# Patient Record
Sex: Female | Born: 2005 | Race: White | Hispanic: No | Marital: Single | Smoking: Never smoker
Health system: Southern US, Community
[De-identification: ages and names within clinical notes are randomized; demographics above are authoritative.]

## PROBLEM LIST (undated history)

## (undated) DIAGNOSIS — R634 Abnormal weight loss: Secondary | ICD-10-CM

## (undated) DIAGNOSIS — T7840XA Allergy, unspecified, initial encounter: Secondary | ICD-10-CM

## (undated) DIAGNOSIS — F411 Generalized anxiety disorder: Secondary | ICD-10-CM

## (undated) HISTORY — DX: Generalized anxiety disorder: F41.1

## (undated) HISTORY — DX: Abnormal weight loss: R63.4

## (undated) HISTORY — DX: Allergy, unspecified, initial encounter: T78.40XA

---
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
## 2023-01-01 ENCOUNTER — Ambulatory Visit: Payer: 59 | Admitting: Adult Health

## 2023-01-01 DIAGNOSIS — G43719 Chronic migraine without aura, intractable, without status migrainosus: Secondary | ICD-10-CM | POA: Diagnosis not present

## 2023-01-01 MED ORDER — ONABOTULINUMTOXINA 200 UNITS IJ SOLR
155.0000 [IU] | Freq: Once | INTRAMUSCULAR | Status: AC
Start: 2023-01-01 — End: 2023-01-01
  Administered 2023-01-01: 155 [IU] via INTRAMUSCULAR

## 2023-01-01 NOTE — Patient Instructions (Addendum)
Your Plan:  Continue Bernita Raisin as needed  Will repeat botox in 3 months but call earlier if needed         Thank you for coming to see [place] at [facility]. I hope we have been able to provide you high quality care today.  You may receive a patient satisfaction survey over the next few weeks. We would appreciate your feedback and comments so that we may continue to improve ourselves and the health of our patients.

## 2023-01-01 NOTE — Progress Notes (Signed)
Patient is here for initial botox injection. Continues to have daily headaches, will use Ubrelvy for more severe migraines with some benefit (takes edge off but doesn't abort). Did do an Ajovy injection (sample) on 9/20, did help some in regards to severity. 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 Will return in 3 months for repeat injection.       Consent Form Botulism Toxin Injection For Chronic Migraine    Reviewed orally with patient, additionally signature is on file:  Botulism toxin has been approved by the Federal drug administration for treatment of chronic migraine. Botulism toxin does not cure chronic migraine and it may not be effective in some patients.  The administration of botulism toxin is accomplished by injecting a small amount of toxin into the muscles of the neck and head. Dosage must be titrated for each individual. Any benefits resulting from botulism toxin tend to wear off after 3 months with a repeat injection required if benefit is to be maintained. Injections are usually done every 3-4 months with maximum effect peak achieved by about 2 or 3 weeks. Botulism toxin is expensive and you should be sure of what costs you will incur resulting from the injection.  The side effects of botulism toxin use for chronic migraine may include:   -Transient, and usually mild, facial weakness with facial injections  -Transient, and usually mild, head or neck weakness with head/neck injections  -Reduction or loss of forehead facial animation due to forehead muscle weakness  -Eyelid drooping  -Dry eye  -Pain at the site of injection or bruising at the site of injection  -Double vision  -Potential unknown long term risks   Contraindications: You should not have Botox if you are pregnant, nursing, allergic to albumin, have an infection, skin  condition, or muscle weakness at the site of the injection, or have myasthenia gravis, Lambert-Eaton syndrome, or ALS.  It is also possible that as with any injection, there may be an allergic reaction or no effect from the medication. Reduced effectiveness after repeated injections is sometimes seen and rarely infection at the injection site may occur. All care will be taken to prevent these side effects. If therapy is given over a long time, atrophy and wasting in the muscle injected may occur. Occasionally the patient's become refractory to treatment because they develop antibodies to the toxin. In this event, therapy needs to be modified.  I have read the above information and consent to the administration of botulism toxin.    BOTOX PROCEDURE NOTE FOR MIGRAINE HEADACHE  Contraindications and precautions discussed with patient(above). Aseptic procedure was observed and patient tolerated procedure. Procedure performed by Ihor Austin, AGNP-BC.   The condition has existed for more than 6 months, and pt does not have a diagnosis of ALS, Myasthenia Gravis or Lambert-Eaton Syndrome.  Risks and benefits of injections discussed and pt agrees to proceed with the procedure.  Written consent obtained  These injections are medically necessary. Pt  receives good benefits from these injections. These injections do not cause sedations or hallucinations which the oral therapies may cause.   Description of procedure:  The patient was placed in a sitting position. The standard protocol was used for Botox as follows, with 5 units of Botox injected at each site:  -Procerus muscle, midline injection  -Corrugator muscle, bilateral injection  -Frontalis muscle, bilateral injection, with  2 sites each side, medial injection was performed in the upper one third of the frontalis muscle, in the region vertical from the medial inferior edge of the superior orbital rim. The lateral injection was again in the upper  one third of the forehead vertically above the lateral limbus of the cornea, 1.5 cm lateral to the medial injection site.  -Temporalis muscle injection, 4 sites, bilaterally. The first injection was 3 cm above the tragus of the ear, second injection site was 1.5 cm to 3 cm up from the first injection site in line with the tragus of the ear. The third injection site was 1.5-3 cm forward between the first 2 injection sites. The fourth injection site was 1.5 cm posterior to the second injection site. 5th site laterally in the temporalis  muscleat the level of the outer canthus.  -Occipitalis muscle injection, 3 sites, bilaterally. The first injection was done one half way between the occipital protuberance and the tip of the mastoid process behind the ear. The second injection site was done lateral and superior to the first, 1 fingerbreadth from the first injection. The third injection site was 1 fingerbreadth superiorly and medially from the first injection site.  -Cervical paraspinal muscle injection, 2 sites, bilaterally. The first injection site was 1 cm from the midline of the cervical spine, 3 cm inferior to the lower border of the occipital protuberance. The second injection site was 1.5 cm superiorly and laterally to the first injection site.  -Trapezius muscle injection was performed at 3 sites, bilaterally. The first injection site was in the upper trapezius muscle halfway between the inflection point of the neck, and the acromion. The second injection site was one half way between the acromion and the first injection site. The third injection was done between the first injection site and the inflection point of the neck.    A total of 200 units of Botox was prepared, 155 units of Botox was injected as documented above, any Botox not injected was wasted. The patient tolerated the procedure well, there were no complications of the above procedure.   Ihor Austin, AGNP-BC  [facility] [address] [place], [place] [address]  Phone [phone] Fax [phone] Note: This document was prepared with digital dictation and possible smart phrase technology. Any transcriptional errors that result from this process are unintentional.

## 2023-01-01 NOTE — Progress Notes (Signed)
Botox- 200 units x 1 vial Lot: [id number] Expiration: 04/2025 NDC: 9147-8295-62  Bacteriostatic 0.9% Sodium Chloride- * mL  Lot: [id number] Expiration: 06/25/2023 NDC: 7846-9629-52  Dx: 43.709 B/B Witnessed by Marlana Latus, RN

## 2023-01-18 ENCOUNTER — Encounter: Payer: Self-pay | Admitting: Adult Health

## 2023-01-22 ENCOUNTER — Other Ambulatory Visit: Payer: Self-pay | Admitting: Psychiatry

## 2023-01-22 MED ORDER — AJOVY 225 MG/1.5ML ~~LOC~~ SOAJ: 1.5000 mL | Freq: Once | SUBCUTANEOUS | Status: AC

## 2023-01-22 NOTE — Telephone Encounter (Signed)
Yes that is fine, hopefully this will help migraines while botox taking effect over the next couple of months. If she wishes to continue Ajovy and botox ongoing, will need to place order through insurance to see if they will approve both but okay to provide sample short term. Thank you.

## 2023-01-23 ENCOUNTER — Ambulatory Visit
Admission: RE | Admit: 2023-01-23 | Discharge: 2023-01-23 | Disposition: A | Discharge status: Home / Self Care | Payer: 59 | Source: Ambulatory Visit | Attending: Psychiatry

## 2023-01-23 DIAGNOSIS — R200 Anesthesia of skin: Secondary | ICD-10-CM

## 2023-01-23 MED ORDER — GADOPICLENOL 0.5 MMOL/ML IV SOLN
5.0000 mL | Freq: Once | INTRAVENOUS | Status: AC | PRN
  Administered 2023-01-23: 5 mL via INTRAVENOUS

## 2023-01-28 ENCOUNTER — Telehealth: Payer: Self-pay

## 2023-01-28 NOTE — Telephone Encounter (Signed)
LVM with pt's CT Venogram results per Dr. Marjory Lies

## 2023-01-28 NOTE — Progress Notes (Signed)
Please call and advise the patient that the recent MRI Cervical spine was within normal limites. No further action is required on this test at this time. Please remind patient to keep any upcoming appointments or tests and to call us with any interim questions, concerns, problems or updates. Thanks,   Windell Norfolk, MD

## 2023-02-08 ENCOUNTER — Ambulatory Visit (INDEPENDENT_AMBULATORY_CARE_PROVIDER_SITE_OTHER): Payer: 59 | Admitting: Psychiatry

## 2023-02-08 DIAGNOSIS — F410 Panic disorder [episodic paroxysmal anxiety] without agoraphobia: Secondary | ICD-10-CM

## 2023-02-08 DIAGNOSIS — F411 Generalized anxiety disorder: Secondary | ICD-10-CM | POA: Diagnosis not present

## 2023-02-08 DIAGNOSIS — F900 Attention-deficit hyperactivity disorder, predominantly inattentive type: Secondary | ICD-10-CM | POA: Diagnosis not present

## 2023-02-08 DIAGNOSIS — F840 Autistic disorder: Secondary | ICD-10-CM | POA: Diagnosis not present

## 2023-02-08 MED ORDER — ESCITALOPRAM OXALATE 20 MG PO TABS
20.0000 mg | ORAL_TABLET | Freq: Every day | ORAL | 1 refills | Status: DC
Start: 2023-02-08 — End: 2023-05-21

## 2023-02-08 MED ORDER — QUILLIVANT XR 25 MG/5ML PO SRER: ORAL | 0 refills | Status: DC

## 2023-02-08 MED ORDER — GUANFACINE HCL ER 2 MG PO TB24
ORAL_TABLET | ORAL | 1 refills | Status: DC
Start: 2023-02-08 — End: 2023-05-21

## 2023-02-11 ENCOUNTER — Encounter: Payer: Self-pay | Admitting: Psychiatry

## 2023-02-11 NOTE — Progress Notes (Signed)
[facility] [address], [place]   Follow-up visit  Date of Service: 02/08/2023  CC/Purpose: Routine medication management follow up.    Terri [name] is a 17 y.o. female with a past psychiatric history of ADHD, ASD, anxiety who presents today for a psychiatric follow up appointment. Patient is in the custody of parents.    The patient was last seen on 11/06/22, at which time the following plan was established: Medication management:             - Continue Lexapro 20mg  daily for anxiety             - Intuniv 2mg  nightly for ADHD             - Quillivant 2.58mL daily and 1mL around lunch time for ADHD _______________________________________________________________________________________ Acute events/encounters since last visit: denies    Terri [name] presents with her mother for her visit. Overall things have been going okay. She has been taking her medicine as prescribed. She feels that the medicines are beneficial and mostly doing okay. She has stopped her birth control and is noticing some emotional changes with this, but doesn't feel it's related to her medicine. She is okay with her current regimen. No SI/HI/AVH.    Sleep: stable Appetite: Stable Depression: denies Bipolar symptoms:  denies Current suicidal/homicidal ideations:  denied Current auditory/visual hallucinations:  denied   Non-Suicidal Self-Injury: denies Suicide Attempt History: denies  Psychotherapy: Fredia Sorrow   Previous psychiatric medication trials:  Field seismologist for school Living Situation: lives with mom, dad     Allergies  Allergen Reactions   Penicillins Hives   Doxycycline Rash      Labs:  reviewed  Medical diagnoses: Patient Active Problem List   Diagnosis Date Noted   Migraines 08/28/2022   Chronic sinusitis 02/10/2021   Seasonal allergies 11/08/2020   Preventative health care 08/04/2020   Ingrown toenail 06/22/2019   Paronychia of  toe 09/23/2018   Menorrhagia with regular cycle 08/13/2018   Autism disorder 05/22/2018   ADHD, 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   There were no vitals taken for this visit.There is no height or weight on file to calculate BMI.  General Appearance: Neat and Well Groomed  Eye Contact:  Fair  Speech:  Clear and Coherent and Normal Rate  Mood:  Euthymic  Affect:  Appropriate and Congruent, happy  Thought Process:  Goal Directed  Orientation:  Full (Time, Place, and Person)  Thought Content:  Logical  Suicidal Thoughts:  No  Homicidal Thoughts:  No  Memory:  Immediate;   Good  Judgement:  Good  Insight:  Good  Psychomotor Activity:  Normal  Concentration:  Concentration: Good  Recall:  Good  Fund of Knowledge:  Good  Language:  Good  Assets:  Communication Skills Desire for Improvement Financial Resources/Insurance Housing Leisure Time Physical Health Resilience Social Support Talents/Skills Transportation Vocational/Educational  Cognition:  WNL      Assessment   Psychiatric Diagnoses:   ICD-10-CM   1. Autism spectrum disorder with accompanying intellectual impairment, requiring support (level 1)  F84.0     2. Generalized anxiety disorder with panic attacks  F41.1 escitalopram (LEXAPRO) 20 MG tablet   F41.0     3. ADHD, predominantly inattentive type  F90.0 guanFACINE (INTUNIV) 2 MG TB24 ER tablet  Methylphenidate HCl ER (QUILLIVANT XR) 25 MG/5ML SRER      Patient complexity: Moderate   Patient Education and Counseling:  Supportive therapy provided for identified psychosocial stressors.  Medication education provided and decisions regarding medication regimen discussed with patient/guardian.   On assessment today, Terri [name] has remained stable from a mental health perspective. She has remained on  her medicine and is doing well with this. She has some emotional changes which appears related to stopping her birth control. We will not adjust her medicines at this time and will monitor how she is doing. No SI/HI/AVH.   Plan  Medication management:  - Continue Lexapro 20mg  daily for anxiety  - Intuniv 2mg  nightly for ADHD  - Quillivant 2.40mL daily and 1mL around lunch time for ADHD  Labs/Studies:  - none today  Additional recommendations:  - Crisis plan reviewed and patient verbally contracts for safety. Go to ED with emergent symptoms or safety concerns and Risks, benefits, side effects of medications, including any / all black box warnings, discussed with patient, who verbalizes their understanding   Follow Up: Return in 3 months - Call in the interim for any side-effects, decompensation, questions, or problems between now and the next visit.   I have spent 25 minutes reviewing the patients chart, meeting with the patient and family, and reviewing medicines and side effects.   Kendal Hymen, MD [facility]

## 2023-02-19 ENCOUNTER — Telehealth: Payer: Self-pay | Admitting: Psychiatry

## 2023-02-19 NOTE — Telephone Encounter (Signed)
PT lvm that she needs a PA on her quillivant xr 25 mg. She said that her pharmacy has sent it to [place] twice. Please call pt back and let her know the status. Her number is [phone]

## 2023-02-19 NOTE — Telephone Encounter (Signed)
Called pt back and let her know that per Dr. Marjory Lies that her CT Venogram was normal. Pt verbalized understanding.

## 2023-02-20 ENCOUNTER — Telehealth: Payer: Self-pay | Admitting: Psychiatry

## 2023-02-20 NOTE — Telephone Encounter (Signed)
No PA noted in CMM will have to review insurance to initiate one, see prior phone message.

## 2023-02-20 NOTE — Telephone Encounter (Signed)
Not sure what they PA is for but will check CMM

## 2023-02-20 NOTE — Telephone Encounter (Signed)
Pt is checking status of PA

## 2023-02-26 NOTE — Telephone Encounter (Signed)
PA initiated with [employer] for Quillivant XR 25MG /5ML suspension, approval is received effective through 02/25/2024.   Will let Mom know.

## 2023-02-26 NOTE — Telephone Encounter (Signed)
See other phone message  

## 2023-04-01 ENCOUNTER — Telehealth: Payer: Self-pay | Admitting: Psychiatry

## 2023-04-01 ENCOUNTER — Other Ambulatory Visit: Payer: Self-pay

## 2023-04-01 DIAGNOSIS — F900 Attention-deficit hyperactivity disorder, predominantly inattentive type: Secondary | ICD-10-CM

## 2023-04-01 NOTE — Telephone Encounter (Signed)
 Pended Qu[facility]ivant to [facility].

## 2023-04-01 NOTE — Telephone Encounter (Signed)
 Mm called to request refill of Marci's Quillivant.  Appt 05/10/23.  [id number] [place], [place] - [address]

## 2023-04-02 MED ORDER — QUILLIVANT XR 25 MG/5ML PO SRER: ORAL | 0 refills | Status: DC

## 2023-04-08 ENCOUNTER — Ambulatory Visit: Payer: 59 | Admitting: Adult Health

## 2023-04-08 DIAGNOSIS — G43719 Chronic migraine without aura, intractable, without status migrainosus: Secondary | ICD-10-CM | POA: Diagnosis not present

## 2023-04-08 MED ORDER — ONABOTULINUMTOXINA 200 UNITS IJ SOLR
155.0000 [IU] | Freq: Once | INTRAMUSCULAR | Status: AC
  Administered 2023-04-08: 155 [IU] via INTRAMUSCULAR

## 2023-04-08 NOTE — Progress Notes (Signed)
 Botox- 200 units x 1 vial Lot:[id number] Expiration: 05/2025 NDC: 9562-1308-65  Bacteriostatic 0.9% Sodium Chloride- * mL  Lot: [id number] Expiration: 01/25/2024 NDC: 2952-8413-24  Dx:G43.719 B/B Witnessed by: Marlana Latus, RN

## 2023-04-08 NOTE — Progress Notes (Signed)
 Patient is here for second botox  injection, prior initial injection on 01/01/2023.  Reports some improvement of migraines after initial Botox  injection, still occurring almost daily but much less severity and are not debilitating. Use of Ubrelvy  for more severe migraines usually with some benefit.  Tolerated procedure well today, does need to lay down for procedure (hx of syncopal episodes d/t needle phobia).  Return in 3 months for repeat injection.     Consent Form Botulism Toxin Injection For Chronic Migraine    Reviewed orally with patient, additionally signature is on file:  Botulism toxin has been approved by the Federal drug administration for treatment of chronic migraine. Botulism toxin does not cure chronic migraine and it may not be effective in some patients.  The administration of botulism toxin is accomplished by injecting a small amount of toxin into the muscles of the neck and head. Dosage must be titrated for each individual. Any benefits resulting from botulism toxin tend to wear off after 3 months with a repeat injection required if benefit is to be maintained. Injections are usually done every 3-4 months with maximum effect peak achieved by about 2 or 3 weeks. Botulism toxin is expensive and you should be sure of what costs you will incur resulting from the injection.  The side effects of botulism toxin use for chronic migraine may include:   -Transient, and usually mild, facial weakness with facial injections  -Transient, and usually mild, head or neck weakness with head/neck injections  -Reduction or loss of forehead facial animation due to forehead muscle weakness  -Eyelid drooping  -Dry eye  -Pain at the site of injection or bruising at the site of injection  -Double vision  -Potential unknown long term risks   Contraindications: You should not have Botox  if you are pregnant, nursing, allergic to albumin, have an infection, skin condition, or muscle weakness  at the site of the injection, or have myasthenia gravis, Lambert-Eaton syndrome, or ALS.  It is also possible that as with any injection, there may be an allergic reaction or no effect from the medication. Reduced effectiveness after repeated injections is sometimes seen and rarely infection at the injection site may occur. All care will be taken to prevent these side effects. If therapy is given over a long time, atrophy and wasting in the muscle injected may occur. Occasionally the patient's become refractory to treatment because they develop antibodies to the toxin. In this event, therapy needs to be modified.  I have read the above information and consent to the administration of botulism toxin.    BOTOX  PROCEDURE NOTE FOR MIGRAINE HEADACHE  Contraindications and precautions discussed with patient(above). Aseptic procedure was observed and patient tolerated procedure. Procedure performed by Harlene Bogaert, AGNP-BC.   The condition has existed for more than 6 months, and pt does not have a diagnosis of ALS, Myasthenia Gravis or Lambert-Eaton Syndrome.  Risks and benefits of injections discussed and pt agrees to proceed with the procedure.  Written consent obtained  These injections are medically necessary. Pt  receives good benefits from these injections. These injections do not cause sedations or hallucinations which the oral therapies may cause.   Description of procedure:  The patient was placed in a sitting position. The standard protocol was used for Botox  as follows, with 5 units of Botox  injected at each site:  -Procerus muscle, midline injection  -Corrugator muscle, bilateral injection  -Frontalis muscle, bilateral injection, with 2 sites each side, medial injection was performed  in the upper one third of the frontalis muscle, in the region vertical from the medial inferior edge of the superior orbital rim. The lateral injection was again in the upper one third of the forehead  vertically above the lateral limbus of the cornea, 1.5 cm lateral to the medial injection site.  -Temporalis muscle injection, 4 sites, bilaterally. The first injection was 3 cm above the tragus of the ear, second injection site was 1.5 cm to 3 cm up from the first injection site in line with the tragus of the ear. The third injection site was 1.5-3 cm forward between the first 2 injection sites. The fourth injection site was 1.5 cm posterior to the second injection site. 5th site laterally in the temporalis  muscleat the level of the outer canthus.  -Occipitalis muscle injection, 3 sites, bilaterally. The first injection was done one half way between the occipital protuberance and the tip of the mastoid process behind the ear. The second injection site was done lateral and superior to the first, 1 fingerbreadth from the first injection. The third injection site was 1 fingerbreadth superiorly and medially from the first injection site.  -Cervical paraspinal muscle injection, 2 sites, bilaterally. The first injection site was 1 cm from the midline of the cervical spine, 3 cm inferior to the lower border of the occipital protuberance. The second injection site was 1.5 cm superiorly and laterally to the first injection site.  -Trapezius muscle injection was performed at 3 sites, bilaterally. The first injection site was in the upper trapezius muscle halfway between the inflection point of the neck, and the acromion. The second injection site was one half way between the acromion and the first injection site. The third injection was done between the first injection site and the inflection point of the neck.    A total of 200 units of Botox  was prepared, 155 units of Botox  was injected as documented above, any Botox  not injected was wasted. The patient tolerated the procedure well, there were no complications of the above procedure.   Harlene Bogaert, AGNP-BC  [facility] [address] [place], [place] [address]  Phone [phone] Fax [phone] Note: This document was prepared with digital dictation and possible smart phrase technology. Any transcriptional errors that result from this process are unintentional.

## 2023-05-10 ENCOUNTER — Ambulatory Visit: Payer: 59 | Admitting: Psychiatry

## 2023-05-16 DIAGNOSIS — D2272 Melanocytic nevi of left lower limb, including hip: Secondary | ICD-10-CM | POA: Diagnosis not present

## 2023-05-16 DIAGNOSIS — D225 Melanocytic nevi of trunk: Secondary | ICD-10-CM | POA: Diagnosis not present

## 2023-05-21 ENCOUNTER — Encounter: Payer: Self-pay | Admitting: Psychiatry

## 2023-05-21 ENCOUNTER — Ambulatory Visit (INDEPENDENT_AMBULATORY_CARE_PROVIDER_SITE_OTHER): Payer: BC Managed Care – PPO | Admitting: Psychiatry

## 2023-05-21 DIAGNOSIS — F411 Generalized anxiety disorder: Secondary | ICD-10-CM | POA: Diagnosis not present

## 2023-05-21 DIAGNOSIS — F840 Autistic disorder: Secondary | ICD-10-CM | POA: Diagnosis not present

## 2023-05-21 DIAGNOSIS — F900 Attention-deficit hyperactivity disorder, predominantly inattentive type: Secondary | ICD-10-CM

## 2023-05-21 DIAGNOSIS — F410 Panic disorder [episodic paroxysmal anxiety] without agoraphobia: Secondary | ICD-10-CM

## 2023-05-21 MED ORDER — ESCITALOPRAM OXALATE 20 MG PO TABS: 20.0000 mg | ORAL_TABLET | Freq: Every day | ORAL | 1 refills | Status: DC

## 2023-05-21 MED ORDER — GUANFACINE HCL ER 2 MG PO TB24: ORAL_TABLET | ORAL | 1 refills | Status: DC

## 2023-05-21 MED ORDER — QUILLIVANT XR 25 MG/5ML PO SRER: ORAL | 0 refills | Status: DC

## 2023-05-21 NOTE — Progress Notes (Signed)
 [facility] [address], [place]   Follow-up visit  Date of Service: 05/21/2023  CC/Purpose: Routine medication management follow up.    Terri [name] is a 18 y.o. female with a past psychiatric history of ADHD, ASD, anxiety who presents today for a psychiatric follow up appointment. Patient is in the custody of parents.    The patient was last seen on 02/08/23, at which time the following plan was established: Medication management:             - Continue Lexapro 20mg  daily for anxiety             - Intuniv 2mg  nightly for ADHD             - Quillivant 2.57mL daily and 1mL around lunch time for ADHD _______________________________________________________________________________________ Acute events/encounters since last visit: denies    Terri [name] presents with her mother for her visit. Overall they report that Terri [name] has been doing pretty well. She has been taking her medicines as prescribed and finds them helpful. She has been dealing with some social stressors but overall has no major issues. She was able to get some accommodations with her school, which has been helpful. No concerns about her medicines today. No SI/HI/AVH.    Sleep: stable Appetite: Stable Depression: denies Bipolar symptoms:  denies Current suicidal/homicidal ideations:  denied Current auditory/visual hallucinations:  denied   Non-Suicidal Self-Injury: denies Suicide Attempt History: denies  Psychotherapy: Fredia Sorrow   Previous psychiatric medication trials:  Field seismologist for school Living Situation: lives with mom, dad     Allergies  Allergen Reactions   Penicillins Hives   Doxycycline Rash      Labs:  reviewed  Medical diagnoses: Patient Active Problem List   Diagnosis Date Noted   Migraines 08/28/2022   Chronic sinusitis 02/10/2021   Seasonal allergies 11/08/2020   Preventative health care 08/04/2020   Ingrown toenail 06/22/2019    Paronychia of toe 09/23/2018   Menorrhagia with regular cycle 08/13/2018   Autism disorder 05/22/2018   ADHD, 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   There were no vitals taken for this visit.There is no height or weight on file to calculate BMI.  General Appearance: Neat and Well Groomed  Eye Contact:  Fair  Speech:  Clear and Coherent and Normal Rate  Mood:  Euthymic  Affect:  Appropriate and Congruent, happy  Thought Process:  Goal Directed  Orientation:  Full (Time, Place, and Person)  Thought Content:  Logical  Suicidal Thoughts:  No  Homicidal Thoughts:  No  Memory:  Immediate;   Good  Judgement:  Good  Insight:  Good  Psychomotor Activity:  Normal  Concentration:  Concentration: Good  Recall:  Good  Fund of Knowledge:  Good  Language:  Good  Assets:  Communication Skills Desire for Improvement Financial Resources/Insurance Housing Leisure Time Physical Health Resilience Social Support Talents/Skills Transportation Vocational/Educational  Cognition:  WNL      Assessment   Psychiatric Diagnoses:   ICD-10-CM   1. Autism spectrum disorder with accompanying intellectual impairment, requiring support (level 1)  F84.0     2. ADHD, predominantly inattentive type  F90.0 Methylphenidate HCl ER (QUILLIVANT XR) 25 MG/5ML SRER    guanFACINE (INTUNIV) 2 MG TB24 ER tablet    3. Generalized anxiety disorder with panic  attacks  F41.1 escitalopram (LEXAPRO) 20 MG tablet   F41.0       Patient complexity: Moderate   Patient Education and Counseling:  Supportive therapy provided for identified psychosocial stressors.  Medication education provided and decisions regarding medication regimen discussed with patient/guardian.   On assessment today, Terri [name] has remained stable from a mental health perspective.We will  not make any adjustments today. She is looking for a new therapist. We will also plan on doing a new prior auth as she has switched insurance. No SI/HI/AVH.   Plan  Medication management:  - Continue Lexapro 20mg  daily for anxiety  - Intuniv 2mg  nightly for ADHD  - Quillivant 2.79mL daily and 1mL around lunch time for ADHD  Labs/Studies:  - none today  Additional recommendations:  - Crisis plan reviewed and patient verbally contracts for safety. Go to ED with emergent symptoms or safety concerns and Risks, benefits, side effects of medications, including any / all black box warnings, discussed with patient, who verbalizes their understanding   Follow Up: Return in 3 months - Call in the interim for any side-effects, decompensation, questions, or problems between now and the next visit.   I have spent 25 minutes reviewing the patients chart, meeting with the patient and family, and reviewing medicines and side effects.   Kendal Hymen, MD [facility]

## 2023-05-22 ENCOUNTER — Telehealth: Payer: Self-pay

## 2023-05-22 NOTE — Telephone Encounter (Signed)
 Prior Authorization Qullivant XR 25 mg/27mL  [employer] [place]

## 2023-05-25 IMAGING — MR MR HEAD W/O CM
6 of 11 series · 26 of 48 positions shown · non-contrast
Comparison: None.

CLINICAL DATA: Headache, chronic, new features or increased
frequency

EXAM:
MRI HEAD WITHOUT CONTRAST
TECHNIQUE: Multiplanar, multiecho pulse sequences of the brain and surrounding
structures were obtained without intravenous contrast.

[Series 2: DWI · axial · 3.0mm · 0.94mm/px · z∈[-139,-3]mm · 8 of 100 slices shown (1 of 2)]
[im 1/100]
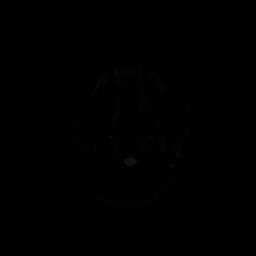
[im 12/100]
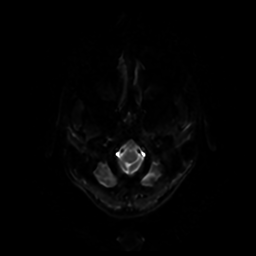
[im 34/100]
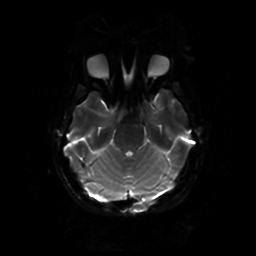
[im 45/100]
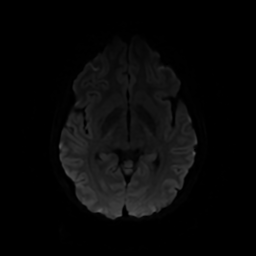
[im 56/100]
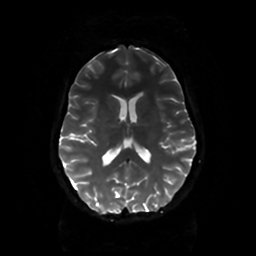
[im 67/100]
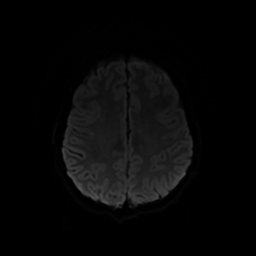
[im 89/100]
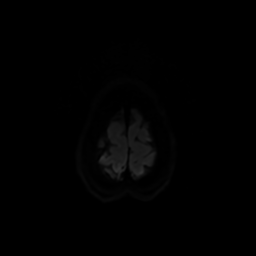
[im 100/100]
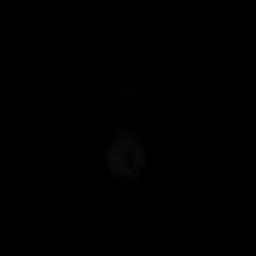

[Series 3: DWI · coronal · 4.0mm · 0.94mm/px · 6 of 72 slices shown (2 of 2)]
[im 1/72]
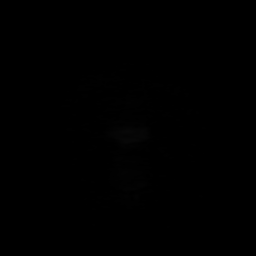
[im 15/72]
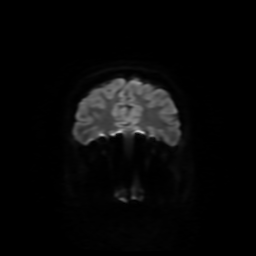
[im 29/72]
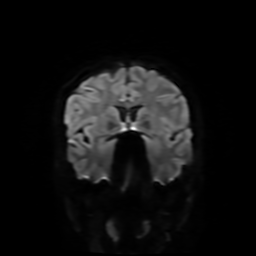
[im 43/72]
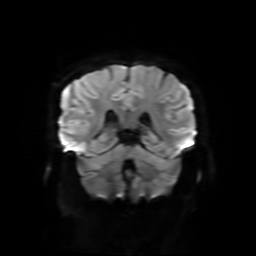
[im 57/72]
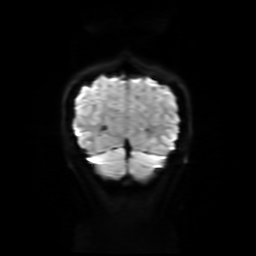
[im 72/72]
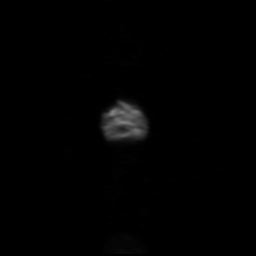

[Series 4: FLAIR · sagittal · 5.0mm · 0.23mm/px · 2 of 25 slices shown (1 of 2)]
[im 1/25]
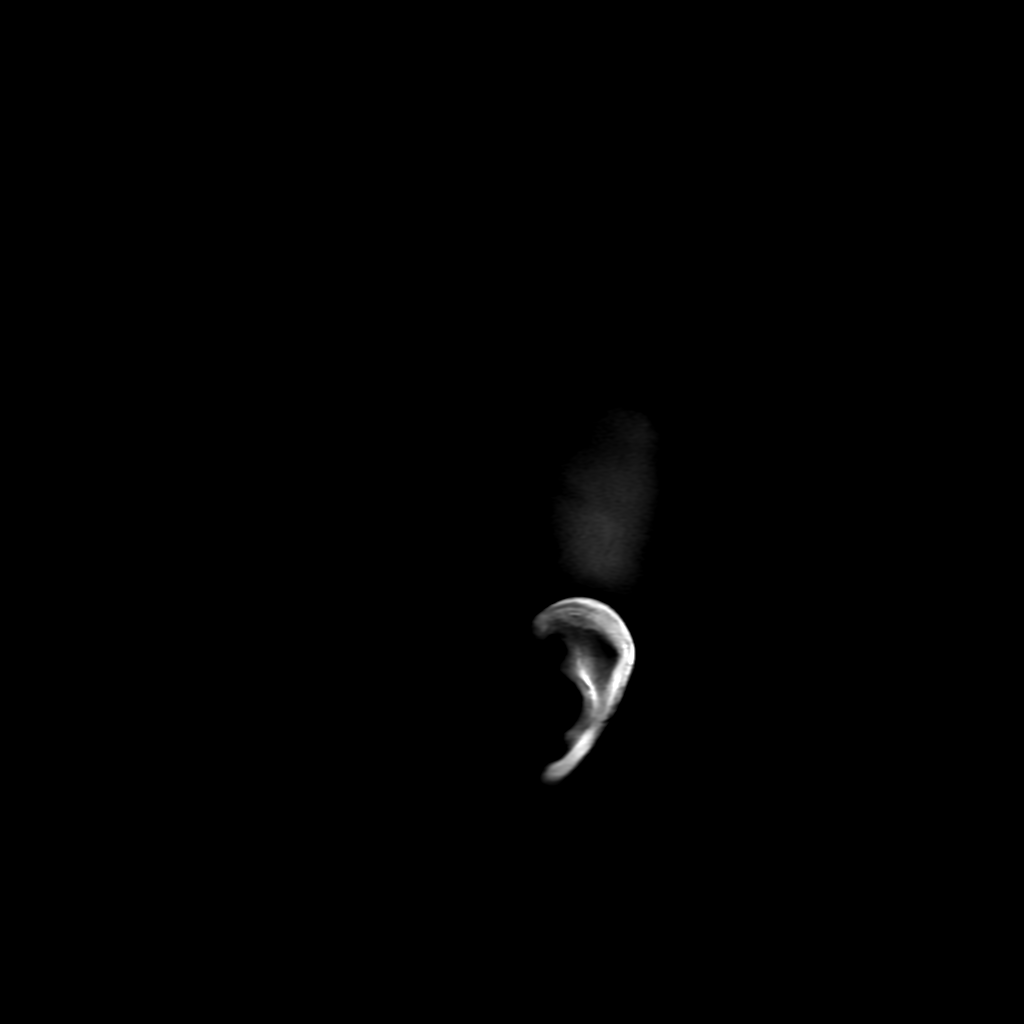
[im 25/25]
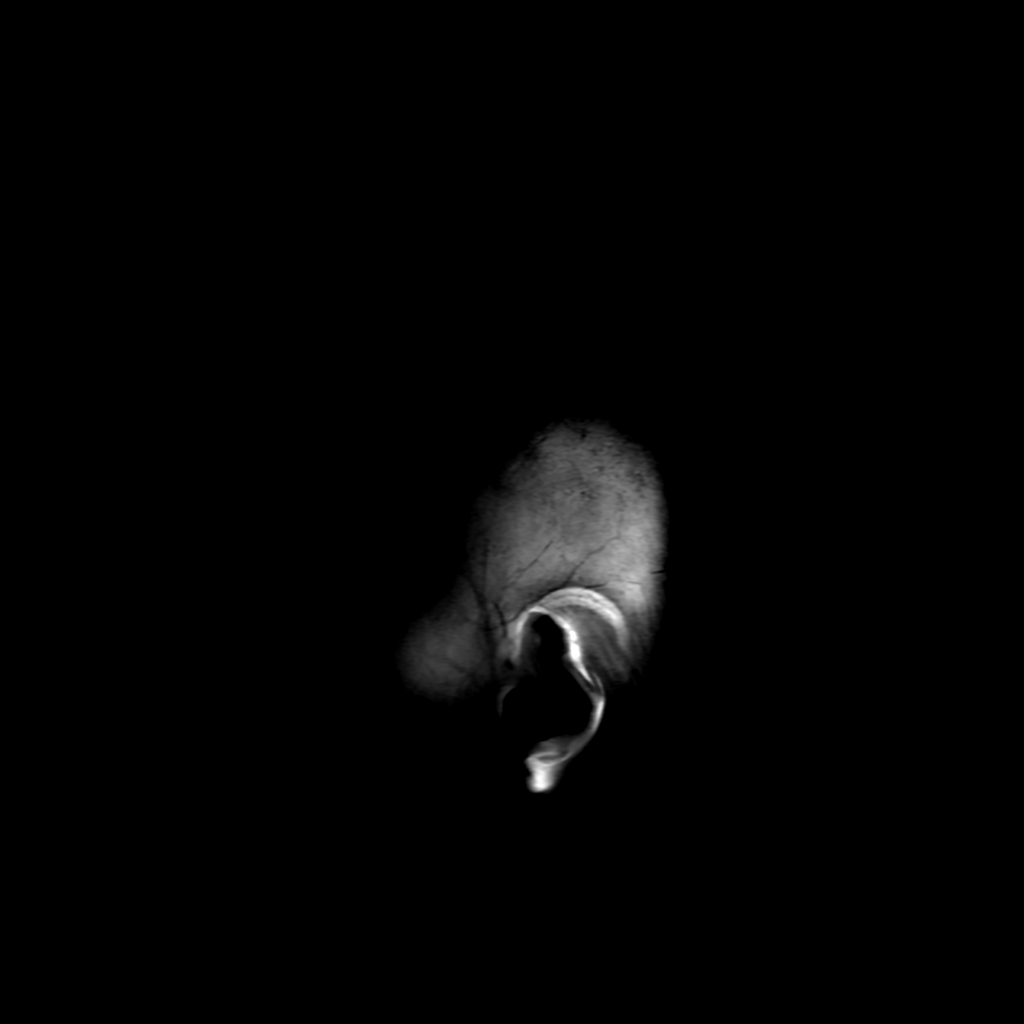

[Series 7: FLAIR · axial · 4.0mm · 0.45mm/px · z∈[-136,-1]mm · 3 of 34 slices shown (2 of 2)]
[im 1/34]
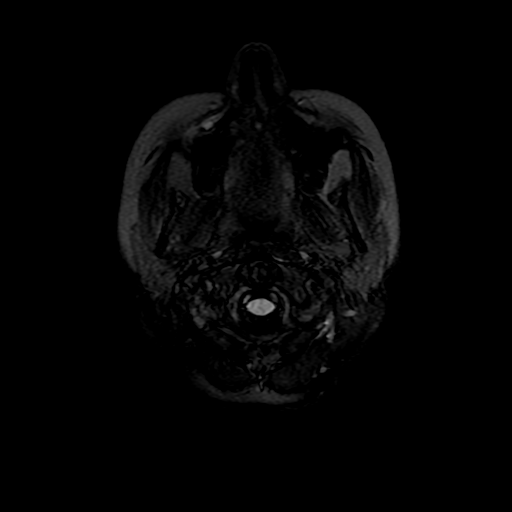
[im 17/34]
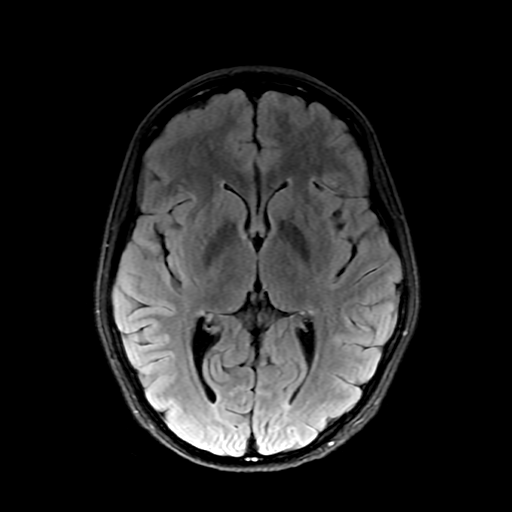
[im 34/34]
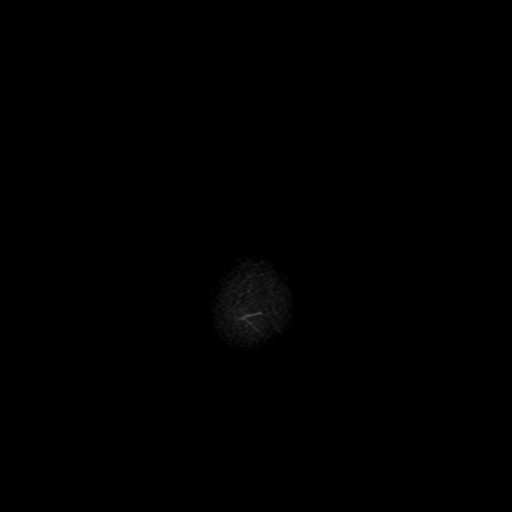

[Series 250: ADC · axial · 3.0mm · 0.94mm/px · z∈[-139,-3]mm · 4 of 49 slices shown (1 of 2)]
[im 1/49]
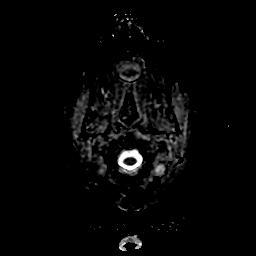
[im 17/49]
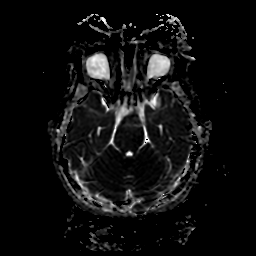
[im 33/49]
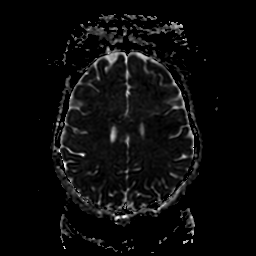
[im 49/49]
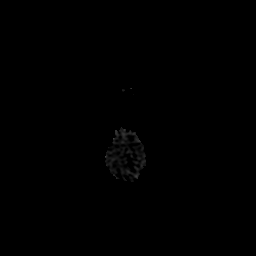

[Series 350: ADC · coronal · 4.0mm · 0.94mm/px · 3 of 36 slices shown (2 of 2)]
[im 1/36]
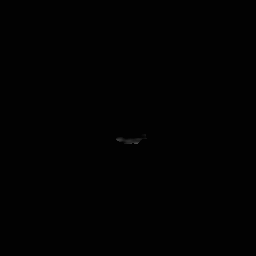
[im 18/36]
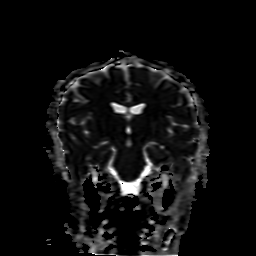
[im 36/36]
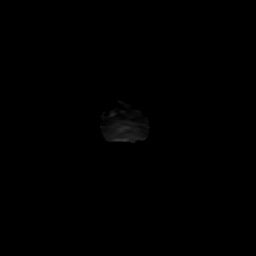

[26 of 48 positions shown; findings below may reference images not displayed]

FINDINGS: Brain: No acute infarction, hemorrhage, hydrocephalus, extra-axial
collection or mass lesion.

Vascular: Major arterial flow voids are maintained at the skull
base.

Skull and upper cervical spine: Normal marrow signal.

Sinuses/Orbits: Clear sinuses.  No acute orbital findings.

Other: No mastoid effusions.
IMPRESSION: Normal brain MRI.  No acute abnormality.

## 2023-06-04 ENCOUNTER — Telehealth: Payer: Self-pay | Admitting: Adult Health

## 2023-06-04 NOTE — Telephone Encounter (Signed)
 Pt has new [employer] [place] insurance. Completed PA form and placed in nurse pod for NP signature.

## 2023-06-06 ENCOUNTER — Encounter: Payer: Self-pay | Admitting: Adult Health

## 2023-06-06 ENCOUNTER — Telehealth: Payer: Self-pay

## 2023-06-06 ENCOUNTER — Other Ambulatory Visit (HOSPITAL_COMMUNITY): Payer: Self-pay

## 2023-06-06 ENCOUNTER — Telehealth: Payer: Self-pay | Admitting: *Deleted

## 2023-06-06 NOTE — Telephone Encounter (Signed)
 Pharmacy Patient Advocate Encounter  Received notification from [employer] [place]  that Prior Authorization for Terri [name] 100MG  tablets has been APPROVED from 06-06-2023 to 03-25-2038   PA #/Case ID/Reference #: [id number]

## 2023-06-06 NOTE — Telephone Encounter (Signed)
 Patient said PA is needed for Ubrelvy.

## 2023-06-06 NOTE — Telephone Encounter (Signed)
 Pharmacy Patient Advocate Encounter   Received notification from Pt Calls Messages that prior authorization for Ubrelvy 100MG  tablets is required/requested.   Insurance verification completed.   The patient is insured through [employer] [place]  .   Per test claim: PA required; PA submitted to above mentioned insurance via CoverMyMeds Key/confirmation #/EOC [id number] Status is pending

## 2023-06-06 NOTE — Telephone Encounter (Signed)
 PA request has been Submitted. New Encounter has been or will be created for follow up. For additional info see Pharmacy Prior Auth telephone encounter from 06-06-2023.

## 2023-06-10 NOTE — Telephone Encounter (Signed)
 Patient received repeat Botox in 03/2023 which was only her second injection. Botox can take up to 3 injections prior to seeing full benefit. She noted improvement of severity and duration after initial injection in 12/2022. Please call patient to see if she is having less migraine days since starting botox in order for insurance to continue to cover.  Thank you.

## 2023-06-10 NOTE — Telephone Encounter (Signed)
 Received fax of denial from [employer] [place]. They state they need to know pt is having less headache days after Botox. Previous note states pt had some improvement, but headaches were still debilitating. Attached denial letter below.

## 2023-06-11 NOTE — Telephone Encounter (Signed)
 Christa from [employer] [place] called back. She requested I send her this documentation reflecting the improvement with botox. Fax # [phone] and attn to her. Fax sent.

## 2023-06-11 NOTE — Telephone Encounter (Signed)
 I called the number on the denial and left a detailed message asking for them to reconsider approving the botox for the patient. Left message including the pt name, member ID, refer #, and provider requesting. I did include what Shanda Bumps stated in her message about having some improvement. I have requested a call back and reached out to the patient as well in the meantime.

## 2023-06-24 ENCOUNTER — Encounter: Payer: Self-pay | Admitting: Adult Health

## 2023-06-24 NOTE — Telephone Encounter (Signed)
 Noted! Thank you

## 2023-06-24 NOTE — Telephone Encounter (Signed)
 FYI- Botox appeal was denied. Sent MyChart msg to pt to see if she'd like to move forward with complaint form.

## 2023-06-26 ENCOUNTER — Telehealth: Payer: Self-pay | Admitting: Adult Health

## 2023-06-26 NOTE — Telephone Encounter (Signed)
 Pt is asking for a call back at [phone]

## 2023-06-26 NOTE — Telephone Encounter (Signed)
 Pt's mother reports that due to cost she is being told they would have to pay for Botox , she would like a call to discuss other options for pt.

## 2023-06-26 NOTE — Telephone Encounter (Signed)
 Spoke w/Pt regarding concerns for cost of Botox. Pt is asking if there is another option since her insurance will not cover the cost. She did inquire on the Botox savings program link she was sent but was told they would not cover any portion of cost if her insurance had not approved the medication. Advised we will make the provider aware. Pt voiced thanks for call back and that she will let her mother know she spoke with [place] since her mother had called earlier.

## 2023-06-26 NOTE — Telephone Encounter (Signed)
 Attempted to call Pt mother to discuss options for Botox cost concerns. No answer, LVM to call back.

## 2023-06-26 NOTE — Telephone Encounter (Signed)
 I have only previously seen her for Botox injections.  If she would like to discuss alternative options, please schedule follow-up visit. Can be either in office or mychart video visit but will need to be for 30 minute length. Thank you.

## 2023-06-27 NOTE — Telephone Encounter (Signed)
 LVM and sent MyChart msg informing pt that Botox appt will be changed to OV to discuss other treatment options.

## 2023-07-02 ENCOUNTER — Encounter: Payer: Self-pay | Admitting: Adult Health

## 2023-07-02 ENCOUNTER — Telehealth (INDEPENDENT_AMBULATORY_CARE_PROVIDER_SITE_OTHER): Payer: 59 | Admitting: Adult Health

## 2023-07-02 DIAGNOSIS — G43109 Migraine with aura, not intractable, without status migrainosus: Secondary | ICD-10-CM | POA: Diagnosis not present

## 2023-07-02 MED ORDER — AJOVY 225 MG/1.5ML ~~LOC~~ SOAJ: 225.0000 mg | SUBCUTANEOUS | 11 refills | Status: AC

## 2023-07-02 MED ORDER — UBRELVY 100 MG PO TABS: 100.0000 mg | ORAL_TABLET | ORAL | 6 refills | Status: DC | PRN

## 2023-07-02 MED ORDER — DICLOFENAC POTASSIUM 50 MG PO TABS: ORAL_TABLET | ORAL | 6 refills | Status: AC

## 2023-07-02 NOTE — Progress Notes (Signed)
 CC:  headaches  Virtual Visit via Video Note  I connected with Terri [name] on 07/02/23 at  9:45 AM EDT by a video enabled telemedicine application and verified that I am speaking with the correct person using two identifiers.  Location: Patient: at home Provider: in office, [facility]   I discussed the limitations of evaluation and management by telemedicine and the availability of in person appointments. The patient expressed understanding and agreed to proceed.    Follow-up Visit   Brief HPI: 18 year old female with a history of autism, GAD who follows in clinic for migraines. MRI brain 06/10/21 was unremarkable.  CTV and MRI C-spine benign.     Interval History:  Returns today to discuss other treatment options for migraine headaches.  Received 2 rounds of Botox with improvement of migraine headaches from daily down to 1 to 2/week but insurance declined to continue coverage. She has filed an appeal about 2 weeks ago. Last botox 1/13.  Currently experiencing about 5 migraines per month, only 1 of those migraines were severe and debilitating.  Use of Ubrelvy and/or diclofenac with benefit.  She can have occasional left eye blurred vision prior to migraine.  She is understandably frustrated that insurance will not continue to cover Botox as she was starting to feel like she was getting her life back and able to function normally again.    Headache days per month: 5 (prior to botox 30) Headache free days per month: 25  Current Headache Regimen: Preventative: none Abortive: Ubrelvy 100 mg PRN, diclofenac    Prior Therapies                                  Rescue: Tylenol ibuprofen Maxalt 10 mg PRN Ubrelvy 100 mg PRN Nurtec 75 mg PRN - lack of efficacy  Prevention: Lexapro 20 mg daily Topamax 100 mg at bedtime - weight loss, lack of efficacy Emgality - helped for 2 weeks, then stopped working Qulipta 30 mg daily - constipation, hair loss MigRelief Botox - helped,  insurance denied Aimovig d/t issues with constipation       ROS:   14 system review of systems performed and negative with exception of those listed in HPI    Outpatient Encounter Medications as of 07/02/2023  Medication Sig Note   acetaminophen (TYLENOL) 500 MG tablet Take 1,000 mg by mouth every 8 (eight) hours as needed.    Aspirin-Salicylamide-Caffeine (BC HEADACHE POWDER PO) Take by mouth. 12/12/2022: Takes prn   diclofenac (CATAFLAM) 50 MG tablet Take 50-100 mg as needed for migraine    drospirenone-ethinyl estradiol (NIKKI) 3-0.02 MG tablet Take 1 tablet by mouth daily. (Patient not taking: Reported on 04/08/2023)    escitalopram (LEXAPRO) 20 MG tablet Take 1 tablet (20 mg total) by mouth daily with breakfast.    guanFACINE (INTUNIV) 2 MG TB24 ER tablet TAKE 1 TABLET BY MOUTH EVERY DAY WITH DINNER    ibuprofen (ADVIL) 200 MG tablet Take 400-600 mg by mouth every 8 (eight) hours as needed.    Melatonin-Pyridoxine (MELATIN PO) Take 5 mg by mouth at bedtime.    Methylphenidate HCl ER (QUILLIVANT XR) 25 MG/5ML SRER Give 2-4 mL after breakfast and 1-2 mL after school for homework and driving    mometasone (NASONEX) 50 MCG/ACT nasal spray Place 2 sprays into the nose daily.    Multiple Vitamin (MULTIVITAMIN) tablet Take 1 tablet by mouth daily.  Omega-3 Fatty Acids (OMEGA 3 500 PO) Take 1 tablet by mouth daily as needed (Take 2 times per day).    Ubrogepant (UBRELVY) 100 MG TABS Take 1 tablet (100 mg total) by mouth as needed (for migraine). May repeat a dose in 2 hours if headache persists. Max dose 2 pills in 24 hours    No facility-administered encounter medications on file as of 07/02/2023.   Past Medical History:  Diagnosis Date   Allergy    GAD (generalized anxiety disorder)    Near syncope 08/13/2018   Weight loss    No past surgical history on file.       Physical Exam:   GENERAL:  well appearing, in no acute distress, alert   NEUROLOGICAL: Mental Status: Alert,  oriented to person, place and time, Follows commands, and Speech fluent and appropriate.    IMPRESSION: 18 year old female with a history of autism, GAD who presents for follow up of migraines.  CTV 12/2022 benign.  MRI cervical spine 12/2022 benign.  Received 2 rounds of Botox in 12/2022 and 03/2023 with decrease in migraine headaches to 1-2/week down from daily but insurance declined continue coverage.  Patient currently appealing insurance decision.    PLAN: -Preventative: While waiting on decision from insurance regarding Botox, recommend initiating Ajovy monthly injection for prevention.  Advised to call after 3 months if no benefit. -Rescue: Continue Ubrelvy and/or diclofenac as needed -Advised to avoid estrogen containing contraceptive measures due to aura migraine    Follow-up in 6 months or call earlier if needed     I spent 25 minutes of face-to-face and non-face-to-face time with patient via MyChart video visit.  This included previsit chart review, lab review, study review, order entry, electronic health record documentation, patient education and discussion regarding above diagnoses and treatment plan and answered all other questions to patient's satisfaction  Ihor Austin, [facility]  [facility] [address] [place], [place] [address]  Phone [phone] Fax [phone] Note: This document was prepared with digital dictation and possible smart phrase technology. Any transcriptional errors that result from this process are unintentional.

## 2023-07-02 NOTE — Patient Instructions (Addendum)
 Your Plan:  Start Ajovy monthly injection  Let me know after 3rd injection if no benefit or sooner if any difficulty tolerating   Continue Ubrelvy and diclofenac as needed for rescue      Follow up in 6 months or call earlier if needed      Thank you for coming to see [place] at [facility]. I hope we have been able to provide you high quality care today.  You may receive a patient satisfaction survey over the next few weeks. We would appreciate your feedback and comments so that we may continue to improve ourselves and the health of our patients.

## 2023-07-04 ENCOUNTER — Telehealth: Payer: Self-pay

## 2023-07-04 ENCOUNTER — Other Ambulatory Visit (HOSPITAL_COMMUNITY): Payer: Self-pay

## 2023-07-04 NOTE — Telephone Encounter (Signed)
 Pharmacy Patient Advocate Encounter   Received notification from Patient Advice Request messages that prior authorization for AJOVY (fremanezumab-vfrm) injection 225MG /1.5ML auto-injectors is required/requested.   Insurance verification completed.   The patient is insured through [facility]  .   Per test claim: PA required; PA submitted to above mentioned insurance via CoverMyMeds Key/confirmation #/EOC [id number] Status is pending

## 2023-07-05 ENCOUNTER — Encounter: Payer: Self-pay | Admitting: Adult Health

## 2023-07-08 ENCOUNTER — Other Ambulatory Visit (HOSPITAL_COMMUNITY): Payer: Self-pay

## 2023-07-08 NOTE — Telephone Encounter (Signed)
 Pharmacy Patient Advocate Encounter  Received notification from [employer] [place]   that Prior Authorization for AJOVY (fremanezumab-vfrm) injection 225MG /1.5ML auto-injectors has been APPROVED from 07/05/2023 to 03/25/2098. Ran test claim, Copay is $24.98. This test claim was processed through [facility]- copay amounts may vary at other pharmacies due to pharmacy/plan contracts, or as the patient moves through the different stages of their insurance plan.   PA #/Case ID/Reference #: PA Case ID #: [id number]

## 2023-07-08 NOTE — Telephone Encounter (Signed)
 Note:

## 2023-07-09 ENCOUNTER — Encounter: Payer: Self-pay | Admitting: Primary Care

## 2023-07-09 ENCOUNTER — Ambulatory Visit: Attending: Primary Care

## 2023-07-09 ENCOUNTER — Ambulatory Visit: Payer: Self-pay | Admitting: Primary Care

## 2023-07-09 VITALS — BP 94/64 | HR 88 | Temp 97.8°F | Ht 60.27 in | Wt 104.0 lb

## 2023-07-09 DIAGNOSIS — R002 Palpitations: Secondary | ICD-10-CM | POA: Insufficient documentation

## 2023-07-09 DIAGNOSIS — R229 Localized swelling, mass and lump, unspecified: Secondary | ICD-10-CM

## 2023-07-09 DIAGNOSIS — R42 Dizziness and giddiness: Secondary | ICD-10-CM

## 2023-07-09 LAB — CBC
HCT: 41.8 % (ref 36.0–49.0)
Hemoglobin: 13.5 g/dL (ref 12.0–16.0)
MCHC: 32.4 g/dL (ref 31.0–37.0)
MCV: 84 fl (ref 78.0–98.0)
Platelets: 262 10*3/uL (ref 150.0–575.0)
RBC: 4.98 Mil/uL (ref 3.80–5.70)
RDW: 14 % (ref 11.4–15.5)
WBC: 8.2 10*3/uL (ref 4.5–13.5)

## 2023-07-09 LAB — IBC + FERRITIN
Ferritin: 11.2 ng/mL (ref 10.0–291.0)
Iron: 72 ug/dL (ref 42–145)
Saturation Ratios: 14.2 % — ABNORMAL LOW (ref 20.0–50.0)
TIBC: 506.8 ug/dL — ABNORMAL HIGH (ref 250.0–450.0)
Transferrin: 362 mg/dL — ABNORMAL HIGH (ref 212.0–360.0)

## 2023-07-09 LAB — BASIC METABOLIC PANEL WITH GFR
BUN: 17 mg/dL (ref 6–23)
CO2: 24 meq/L (ref 19–32)
Calcium: 9.9 mg/dL (ref 8.4–10.5)
Chloride: 103 meq/L (ref 96–112)
Creatinine, Ser: 0.61 mg/dL (ref 0.40–1.20)
GFR: 129.57 mL/min (ref >60.00)
Glucose, Bld: 93 mg/dL (ref 70–99)
Potassium: 4.4 meq/L (ref 3.5–5.1)
Sodium: 135 meq/L (ref 135–145)

## 2023-07-09 LAB — TSH: TSH: 1.31 u[IU]/mL (ref 0.40–5.00)

## 2023-07-09 NOTE — Assessment & Plan Note (Signed)
 Exam today represent above small sebaceous cyst. No suspicious lesions.  Recommended watchful waiting.  She will update if the areas come increase in size and become more painful.

## 2023-07-09 NOTE — Progress Notes (Signed)
 Subjective:    Patient ID: Terri [name], female    DOB: 01-08-2006, 18 y.o.   MRN: [id number]  HPI  Terri [name] is a very pleasant 18 y.o. female who presents today to discuss skin mass and dizziness.   Her mother joins [place] today.   1) Skin Masses: The skin masses are located to the scalp which she first noticed after her last Botox injection in mid January 2025. She's noticed tenderness upon palpation.   She denies drainage, increased size, itching.   2) Vasovagal Dizziness/Palpitations: Chronic and intermittent for the last 1 year. Symptoms include sudden lightheadedness which lasts or a few second to minutes, resolve without intervention. Symptoms occur daily, mostly in the evening with rising from a seated position and when standing. She denies room spinning sensation.   She denies syncope recently, last episode was when she was 12 years ago, was on her menstrual cycle. She drinks 30 oz of water daily. Daily Pepsi once daily. She's had Covid 19 infection twice within the last year.   Review of Systems  Eyes:  Negative for visual disturbance.  Respiratory:  Negative for shortness of breath.   Cardiovascular:  Positive for palpitations. Negative for chest pain.  Genitourinary:  Positive for menstrual problem.  Neurological:  Positive for dizziness and light-headedness.         Past Medical History:  Diagnosis Date   Allergy    GAD (generalized anxiety disorder)    Near syncope 08/13/2018   Weight loss     Social History   Socioeconomic History   Marital status: Single    Spouse name: Not on file   Number of children: Not on file   Years of education: Not on file   Highest education level: Some college, no degree  Occupational History   Not on file  Tobacco Use   Smoking status: Never   Smokeless tobacco: Never  Vaping Use   Vaping status: Never Used  Substance and Sexual Activity   Alcohol use: Never   Drug use: Never   Sexual activity: Not  Currently  Other Topics Concern   Not on file  Social History Narrative   Not on file   Social Drivers of Health   Financial Resource Strain: Low Risk  (07/08/2023)   Overall Financial Resource Strain (CARDIA)    Difficulty of Paying Living Expenses: Not hard at all  Food Insecurity: No Food Insecurity (07/08/2023)   Hunger Vital Sign    Worried About Running Out of Food in the Last Year: Never true    Ran Out of Food in the Last Year: Never true  Transportation Needs: No Transportation Needs (07/08/2023)   PRAPARE - Administrator, Civil Service (Medical): No    Lack of Transportation (Non-Medical): No  Physical Activity: Insufficiently Active (07/08/2023)   Exercise Vital Sign    Days of Exercise per Week: 2 days    Minutes of Exercise per Session: 30 min  Stress: Stress Concern Present (07/08/2023)   [employer] of Occupational Health - Occupational Stress Questionnaire    Feeling of Stress : To some extent  Social Connections: Socially Isolated (07/08/2023)   Social Connection and Isolation Panel [NHANES]    Frequency of Communication with Friends and Family: More than three times a week    Frequency of Social Gatherings with Friends and Family: Once a week    Attends Religious Services: Never    Database administrator or Organizations: No  Attends Banker Meetings: Not on file    Marital Status: Never married  Intimate Partner Violence: Not on file    History reviewed. No pertinent surgical history.  Family History  Problem Relation Age of Onset   Alpha-1 antitrypsin deficiency Mother    ADD / ADHD Mother    Learning disabilities Mother    Seizures Mother    Hypertension Mother    Anxiety disorder Mother    Meniere's disease Maternal Aunt    Autism Maternal Aunt    Bipolar disorder Maternal Aunt    Hypertension Maternal Grandmother    Bipolar disorder Maternal Grandmother    Hypertension Maternal Grandfather    Heart disease Maternal  Grandfather    Hypertension Paternal Grandmother    Heart disease Paternal Grandmother    Depression Paternal Grandmother    Hypertension Paternal Grandfather     Allergies  Allergen Reactions   Penicillins Hives   Doxycycline Rash    Current Outpatient Medications on File Prior to Visit  Medication Sig Dispense Refill   acetaminophen (TYLENOL) 500 MG tablet Take 1,000 mg by mouth every 8 (eight) hours as needed.     Aspirin-Salicylamide-Caffeine (BC HEADACHE POWDER PO) Take by mouth.     diclofenac (CATAFLAM) 50 MG tablet Take 50-100 mg as needed for migraine 15 tablet 6   escitalopram (LEXAPRO) 20 MG tablet Take 1 tablet (20 mg total) by mouth daily with breakfast. 90 tablet 1   Fremanezumab-vfrm (AJOVY) 225 MG/1.5ML SOAJ Inject 225 mg into the skin every 30 (thirty) days. 1.68 mL 11   guanFACINE (INTUNIV) 2 MG TB24 ER tablet TAKE 1 TABLET BY MOUTH EVERY DAY WITH DINNER 90 tablet 1   ibuprofen (ADVIL) 200 MG tablet Take 400-600 mg by mouth every 8 (eight) hours as needed.     Melatonin-Pyridoxine (MELATIN PO) Take 5 mg by mouth at bedtime.     Methylphenidate HCl ER (QUILLIVANT XR) 25 MG/5ML SRER Give 2-4 mL after breakfast and 1-2 mL after school for homework and driving 161 mL 0   mometasone (NASONEX) 50 MCG/ACT nasal spray Place 2 sprays into the nose daily.     Multiple Vitamin (MULTIVITAMIN) tablet Take 1 tablet by mouth daily.     Omega-3 Fatty Acids (OMEGA 3 500 PO) Take 1 tablet by mouth daily as needed (Take 2 times per day).     Ubrogepant (UBRELVY) 100 MG TABS Take 1 tablet (100 mg total) by mouth as needed (for migraine). May repeat a dose in 2 hours if headache persists. Max dose 2 pills in 24 hours 16 tablet 6   No current facility-administered medications on file prior to visit.    BP 102/60   Pulse 88   Temp 97.8 F (36.6 C) (Temporal)   Ht 5' 0.27" (1.531 m)   Wt 104 lb (47.2 kg)   SpO2 95%   BMI 20.13 kg/m  Objective:   Physical Exam Cardiovascular:      Rate and Rhythm: Normal rate and regular rhythm.  Pulmonary:     Effort: Pulmonary effort is normal.     Breath sounds: Normal breath sounds.  Musculoskeletal:     Cervical back: Neck supple.  Skin:    General: Skin is warm and dry.  Neurological:     Mental Status: She is alert and oriented to person, place, and time.  Psychiatric:        Mood and Affect: Mood normal.           Assessment &  Plan:  Palpitations Assessment & Plan: Checking labs today including CBC, iron studies, TSH, BMP.  ECG today with NSR, rate of 82, no PAC or PVC, acute ST changes.   Holter monitor ordered and pending. Offered echocardiogram for which she declines.  Orders: -     LONG TERM MONITOR (3-14 DAYS); Future -     EKG 12-Lead -     IBC + Ferritin -     Basic metabolic panel with GFR -     CBC -     TSH  Dizziness Assessment & Plan: Checking labs today including CBC, iron studies, TSH, BMP.  ECG today with NSR, rate of 82, no PAC or PVC, acute ST changes.   Holter monitor ordered and pending. Offered echocardiogram for which she declines.  Negative orthostatic vitals today.  Orders: -     LONG TERM MONITOR (3-14 DAYS); Future -     IBC + Ferritin -     Basic metabolic panel with GFR -     CBC -     TSH  Localized skin mass, lump, or swelling Assessment & Plan: Exam today represent above small sebaceous cyst. No suspicious lesions.  Recommended watchful waiting.  She will update if the areas come increase in size and become more painful.         Hubbert Landrigan K Saleem Coccia, NP

## 2023-07-09 NOTE — Patient Instructions (Signed)
 Stop by the lab prior to leaving today. I will notify you of your results once received.   Complete the Holter monitor kit once received.  I will be in touch with results.  Try to rise from a seated position slowly.  Stay hydrated with plenty of water.  It was a pleasure to see you today!

## 2023-07-09 NOTE — Assessment & Plan Note (Addendum)
 Checking labs today including CBC, iron studies, TSH, BMP.  ECG today with NSR, rate of 82, no PAC or PVC, acute ST changes.   Holter monitor ordered and pending. Offered echocardiogram for which she declines.  Negative orthostatic vitals today.

## 2023-07-09 NOTE — Assessment & Plan Note (Addendum)
 Checking labs today including CBC, iron studies, TSH, BMP.  ECG today with NSR, rate of 82, no PAC or PVC, acute ST changes.   Holter monitor ordered and pending. Offered echocardiogram for which she declines.

## 2023-08-12 ENCOUNTER — Ambulatory Visit: Payer: Self-pay | Admitting: Psychiatry

## 2023-08-12 ENCOUNTER — Encounter: Payer: Self-pay | Admitting: Psychiatry

## 2023-08-12 DIAGNOSIS — F900 Attention-deficit hyperactivity disorder, predominantly inattentive type: Secondary | ICD-10-CM

## 2023-08-12 DIAGNOSIS — F410 Panic disorder [episodic paroxysmal anxiety] without agoraphobia: Secondary | ICD-10-CM | POA: Diagnosis not present

## 2023-08-12 DIAGNOSIS — F411 Generalized anxiety disorder: Secondary | ICD-10-CM | POA: Diagnosis not present

## 2023-08-12 DIAGNOSIS — F840 Autistic disorder: Secondary | ICD-10-CM

## 2023-08-12 MED ORDER — GUANFACINE HCL ER 2 MG PO TB24: ORAL_TABLET | ORAL | 1 refills | Status: DC

## 2023-08-12 MED ORDER — QUILLIVANT XR 25 MG/5ML PO SRER: ORAL | 0 refills | Status: DC

## 2023-08-12 MED ORDER — ESCITALOPRAM OXALATE 20 MG PO TABS: 20.0000 mg | ORAL_TABLET | Freq: Every day | ORAL | 1 refills | Status: DC

## 2023-08-12 NOTE — Progress Notes (Signed)
 [facility] [address], [place]   Follow-up visit  Date of Service: 08/12/2023  CC/Purpose: Routine medication management follow up.    Terri [name] is a 18 y.o. female with a past psychiatric history of ADHD, ASD, anxiety who presents today for a psychiatric follow up appointment. Patient is in the custody of parents.    The patient was last seen on 05/21/23, at which time the following plan was established: Medication management:             - Continue Lexapro  20mg  daily for anxiety             - Intuniv  2mg  nightly for ADHD             - Quillivant  2.32mL daily and 1mL around lunch time for ADHD _______________________________________________________________________________________ Acute events/encounters since last visit: denies    Terri [name] presents with her mother for her visit. They feel that Terri [name] has been doing pretty well overall. She has been taking her medicine without any major issues today. She finished her first year of school, and this went well. 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 She will be in an apartment for next school year. They are interested in finding a new therapist. No concerns about her medicines today. No SI/HI/AVH.    Sleep: stable Appetite: Stable Depression: denies Bipolar symptoms:  denies Current suicidal/homicidal ideations:  denied Current auditory/visual hallucinations:  denied   Non-Suicidal Self-Injury: denies Suicide Attempt History: denies  Psychotherapy: Interested in finding a new therapist  Previous psychiatric medication trials:  Str[employer]     [employer] for school Living Situation: lives with mom, dad     Allergies  Allergen Reactions   Penicillins Hives   Doxycycline Rash      Labs:  reviewed  Medical diagnoses: Patient Active Problem List   Diagnosis Date Noted   Palpitations 07/09/2023   Localized skin mass, lump, or swelling 07/09/2023   Dizziness 07/09/2023    Migraines 08/28/2022   Chronic sinusitis 02/10/2021   Seasonal allergies 11/08/2020   Preventative health care 08/04/2020   Ingrown toenail 06/22/2019   Paronychia of toe 09/23/2018   Menorrhagia with regular cycle 08/13/2018   Autism disorder 05/22/2018   ADHD, 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   There were no vitals taken for this visit.There is no height or weight on file to calculate BMI.  General Appearance: Neat and Well Groomed  Eye Contact:  Fair  Speech:  Clear and Coherent and Normal Rate  Mood:  Euthymic  Affect:  Appropriate and Congruent, happy  Thought Process:  Goal Directed  Orientation:  Full (Time, Place, and Person)  Thought Content:  Logical  Suicidal Thoughts:  No  Homicidal Thoughts:  No  Memory:  Immediate;   Good  Judgement:  Good  Insight:  Good  Psychomotor Activity:  Normal  Concentration:  Concentration: Good  Recall:  Good  Fund of Knowledge:  Good  Language:  Good  Assets:  Communication Skills Desire for Improvement Financial Resources/Insurance Housing Leisure Time Physical Health Resilience Social Support Talents/Skills Transportation Vocational/Educational  Cognition:  WNL      Assessment   Psychiatric Diagnoses:   ICD-10-CM   1. Autism spectrum disorder with accompanying intellectual impairment, requiring support (level 1)  F84.0     2. Generalized anxiety disorder  with panic attacks  F41.1 escitalopram  (LEXAPRO ) 20 MG tablet   F41.0     3. ADHD, predominantly inattentive type  F90.0 guanFACINE  (INTUNIV ) 2 MG TB24 ER tablet    Methylphenidate  HCl ER (QUILLIVANT  XR) 25 MG/5ML SRER      Patient complexity: Moderate   Patient Education and Counseling:  Supportive therapy provided for identified psychosocial stressors.  Medication education provided and  decisions regarding medication regimen discussed with patient/guardian.   On assessment today, Terri [name] has remained stable from a mental health perspective. She has done well with school and work. She is interested in finding a new therapist. No concerns outside of this today. No SI/HI/AVH.   Plan  Medication management:  - Continue Lexapro  20mg  daily for anxiety  - Intuniv  2mg  nightly for ADHD  - Quillivant  2.56mL daily and 1mL around lunch time for ADHD  Labs/Studies:  - none today  Additional recommendations:  - Crisis plan reviewed and patient verbally contracts for safety. Go to ED with emergent symptoms or safety concerns and Risks, benefits, side effects of medications, including any / all black box warnings, discussed with patient, who verbalizes their understanding   Follow Up: Return in 3 months - Call in the interim for any side-effects, decompensation, questions, or problems between now and the next visit.   I have spent 25 minutes reviewing the patients chart, meeting with the patient and family, and reviewing medicines and side effects.   Terri Base, MD [facility]

## 2023-08-13 ENCOUNTER — Encounter: Payer: Self-pay | Admitting: Adult Health

## 2023-08-14 MED ORDER — METHYLPREDNISOLONE 4 MG PO TBPK: ORAL_TABLET | ORAL | 0 refills | Status: DC

## 2023-08-20 ENCOUNTER — Encounter: Payer: Self-pay | Admitting: Adult Health

## 2023-08-22 ENCOUNTER — Encounter: Payer: Self-pay | Admitting: Neurology

## 2023-08-22 ENCOUNTER — Ambulatory Visit: Admitting: Neurology

## 2023-08-22 VITALS — BP 96/65 | HR 114 | Wt 104.0 lb

## 2023-08-22 DIAGNOSIS — G43711 Chronic migraine without aura, intractable, with status migrainosus: Secondary | ICD-10-CM | POA: Diagnosis not present

## 2023-08-22 DIAGNOSIS — G43901 Migraine, unspecified, not intractable, with status migrainosus: Secondary | ICD-10-CM | POA: Diagnosis not present

## 2023-08-22 NOTE — Progress Notes (Signed)
 CC:  headaches  Brief HPI: Interval History: 08/22/2023 18 year old female with a history of autism, GAD who follows in clinic for migraines. MRI brain 06/10/21 was unremarkable.  CTV and MRI C-spine benign.  Here for acute visit, severe migraine, intractable. Over the last 3 months patient has 30 migraine days a month that can last 24 hours and be moderate to severe. No medication overuse. No aura. Migraines are pulsating/pounding/throbbing, nausea, no vomiting, dizziness associated, hurts to move, light/sound/smell sensitivity, affecting her life, has tried lifestyle modification, sleeping well,Examined Lifestyle modification, headache diaries, better sleep hygiene, exercise, management of migraine triggers, OTC and prescribed analgesics/nsaids such as ibuprofen, excedrin, alleve and others, Aimovig contraindicated due to constipation, tried Ajovy  and Emgality  and failed a plethora of medication. Daily headache days a month and daily migrane days a month for the last > 3 months, no medication overuse, no aura. Pulsating/pounding/throbbing, light and sound sensitivity, nausea, vomiting, hurts to move, migraines are severe and can last 8-24 hours or more, unilateral but can spread to be holocephalic. They are significantly affecting quality of life with work, family, school.    Returns today to discuss other treatment options for migraine headaches.  Received 2 rounds of Botox  with significant  improvement of migraine headaches from daily down to 1 to 2/week but insurance declined to continue coverage. She has filed an appeal about 2 weeks ago. Last botox  1/13.     Prior Therapies                                  Rescue tried > 3 months Tylenol ibuprofen Maxalt  10 mg PRN (seratonin syndrome triptans contraindicated)  Diclofenac  tylenol Ubrelvy  100 mg PRN Nurtec 75 mg PRN - lack of efficacy Lifestyle/diet modification and headache diaries, compliant with medications  Prevention: tried > 3  months  Lexapro  20 mg daily Topamax  100 mg at bedtime - weight loss, lack of efficacy Emgality  - helped for 2 weeks, then stopped working Qulipta  30 mg daily - constipation, hair loss MigRelief Botox  - helped tremendously with pain, quality of life, insurance denied, had >80% improvement in migraine freq and severity after even the first injections had 2 injections and then insurance declined. 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    Aspirin-Salicylamide-Caffeine (BC HEADACHE POWDER PO) Take by mouth. 12/12/2022: Takes prn   diclofenac  (CATAFLAM ) 50 MG tablet Take 50-100 mg as needed for migraine    escitalopram  (LEXAPRO ) 20 MG tablet Take 1 tablet (20 mg total) by mouth daily with breakfast.    Fremanezumab -vfrm (AJOVY ) 225 MG/1.5ML SOAJ Inject 225 mg into the skin every 30 (thirty) days.    guanFACINE  (INTUNIV ) 2 MG TB24 ER tablet TAKE  1 TABLET BY MOUTH EVERY DAY WITH DINNER    ibuprofen (ADVIL) 200 MG tablet Take 400-600 mg by mouth every 8 (eight) hours as needed.    Melatonin-Pyridoxine (MELATIN PO) Take 5 mg by mouth at bedtime.    Methylphenidate  HCl ER (QUILLIVANT  XR) 25 MG/5ML SRER Give 2-4 mL after breakfast and 1-2  mL after school for homework and driving    methylPREDNISolone  (MEDROL  DOSEPAK) 4 MG TBPK tablet Taper pack as directed    mometasone (NASONEX) 50 MCG/ACT nasal spray Place 2 sprays into the nose daily.    Multiple Vitamin (MULTIVITAMIN) tablet Take 1 tablet by mouth daily.    Omega-3 Fatty Acids (OMEGA 3 500 PO) Take 1 tablet by mouth daily as needed (Take 2 times per day).    Ubrogepant  (UBRELVY ) 100 MG TABS Take 1 tablet (100 mg total) by mouth as needed (for migraine). May repeat a dose in 2 hours if headache persists. Max dose 2 pills in 24 hours    No facility-administered encounter medications on file as of 08/22/2023.   Past Medical History:  Diagnosis Date   Allergy    GAD (generalized anxiety disorder)    Near syncope 08/13/2018   Weight loss    History reviewed. No pertinent surgical history.   Physical exam: Exam: Gen: distressed, headache, conversant      CV: No palpitations or chest pain or SOB. VS: Breathing at a normal rate. Weight normal. Not febrile. Eyes: Conjunctivae clear without exudates or hemorrhage  Neuro: Detailed Neurologic Exam  Speech:    Speech is normal; fluent and spontaneous with normal comprehension.  Cognition:    The patient is oriented to person, place, and time;     recent and remote memory intact;     language fluent;     normal attention, concentration, fund of knowledge Cranial Nerves:    The pupils are equal, round, and reactive to light. Visual fields are full Extraocular movements are intact.  The face is symmetric with normal sensation. The palate elevates in the midline. Hearing intact. Voice is normal. Shoulder shrug is normal. The tongue has normal motion without fasciculations.   Coordination: normal  Gait:    No abnormalities noted or reported  Motor Observation:   no involuntary movements noted. Tone:    Appears normal  Posture:    Posture is normal. normal erect    Strength:    Strength is anti-gravity and  symmetric in the upper and lower limbs.      Sensation: intact to LT, no reports of numbness or tingling or paresthesias         IMPRESSION: 18 year old female with a history of autism, GAD who presents for follow up of migraines.  CTV 12/2022 benign.  MRI cervical spine 12/2022 benign.  08/22/2023 18 year old female with a history of autism, GAD who follows in clinic for migraines. MRI brain 06/10/21 was unremarkable.  CTV and MRI C-spine benign.   Botox  - helped tremendously with pain, quality of life, insurance denied, had >80% improvement in migraine freq and severity after even the first injections had 2 injections and then insurance declined. PRIOR TO BOTOX  SHE HAD 30 MIGRAINE DAYS A MONTH FOR > 2 years and after first botox  for migraine treatment she improved to 5 MIGRAINE DAYS A MONTH THAT WERE >80% improved severity SPECTACULAR IMPROVEMENT see all tried/failed medications below there should be NO REASON this young lady should not get insurance approval for botox  for migraines.  Here for acute visit, severe migraine, intractable. Over the last 3 months patient has 30 migraine days a month that can last 24 hours and be moderate to severe. No medication overuse. No aura. Migraines are pulsating/pounding/throbbing, nausea, no vomiting, dizziness associated, hurts to move, light/sound/smell sensitivity, affecting her life, has tried lifestyle modification, sleeping well,Examined Lifestyle modification, headache diaries, better sleep hygiene, exercise, management of migraine triggers, OTC and prescribed analgesics/nsaids such as ibuprofen, excedrin, alleve and others, Aimovig contraindicated due to constipation, tried Ajovy  and Emgality  and failed a plethora of medication. Daily headache days a month and daily migrane days a month for the last > 3 months, no medication overuse, no aura. Pulsating/pounding/throbbing, light and sound sensitivity, nausea, vomiting, hurts to move, migraines are severe  and can last 8-24 hours or more, unilateral but can spread to be holocephalic. They are significantly affecting quality of life with work, family, school.    Returns today to discuss other treatment options for migraine headaches.  Received 2 rounds of Botox  with significant  improvement of migraine headaches from daily down to 1 to 2/week but insurance declined to continue coverage. She has filed an appeal about 2 weeks ago. Last botox  1/13.  Took her to migraine cocktail today. WILL TRY AND GET BOTOX  APPROVAL AGAIN    Prior Therapies                                  Rescue tried > 3 months Tylenol ibuprofen Maxalt  10 mg PRN (seratonin syndrome triptans contraindicated)  Diclofenac  tylenol Ubrelvy  100 mg PRN Nurtec 75 mg PRN - lack of efficacy Lifestyle/diet modification and headache diaries, compliant with medications  Prevention: tried > 3 months  Lexapro  20 mg daily Topamax  100 mg at bedtime - weight loss, lack of efficacy Emgality  - helped for 2 weeks, then stopped working Qulipta  30 mg daily - constipation, hair loss MigRelief Botox  - helped tremendously with pain, quality of life, insurance denied, had >80% improvement in migraine freq and severity after even the first injections had 2 injections and then insurance declined. 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 Chronic migraine without aura, with intractable migraine, so stated, with status migrainosus   2. Status migrainosus    diagnosis.  This included previsit chart review, lab review, study review, order entry, electronic health record documentation, patient education on the different diagnostic and therapeutic options, counseling and coordination of care, risks and benefits of management, compliance, or risk factor reduction

## 2023-08-22 NOTE — Progress Notes (Signed)
 Botox - 200 units x 1 vial Lot: [id number] Expiration: 01/2025 NDC: 1610-9604-54  Bacteriostatic 0.9% Sodium Chloride- 4 mL  Lot: [id number] Expiration: 08/2024 NDC: 1478-2956-21  Dx: H08.657  Sample  Witnessed by Sandy,RN

## 2023-08-24 DIAGNOSIS — R42 Dizziness and giddiness: Secondary | ICD-10-CM

## 2023-08-24 DIAGNOSIS — G43711 Chronic migraine without aura, intractable, with status migrainosus: Secondary | ICD-10-CM | POA: Insufficient documentation

## 2023-08-24 DIAGNOSIS — G43901 Migraine, unspecified, not intractable, with status migrainosus: Secondary | ICD-10-CM | POA: Insufficient documentation

## 2023-08-24 DIAGNOSIS — R002 Palpitations: Secondary | ICD-10-CM | POA: Diagnosis not present

## 2023-08-26 ENCOUNTER — Telehealth: Payer: Self-pay | Admitting: Neurology

## 2023-08-26 DIAGNOSIS — G43711 Chronic migraine without aura, intractable, with status migrainosus: Secondary | ICD-10-CM

## 2023-08-26 NOTE — Telephone Encounter (Signed)
 Submitted benefit verification, will update with results. [id number]

## 2023-08-26 NOTE — Telephone Encounter (Signed)
-----   Message from Glory Larsen sent at 08/24/2023  7:12 PM EDT ----- Regarding: g43.711 G43.711 Chriss Redel/pod4 and prior auth approval team   This is an updated note let's please try and get this patient approved for botox  for migraine from BC/BS with this documentation. I know she was denied earlier but I have significantly updated and expanded the documentation please let's try again if we can  If she is approved she already has a schedued botox  appointment with jessica(thinking positively)  Thank you   [facility], I am sorry to add you directly,  I am not sure what the group name is for preauth team. I am including you because I wanted you all to know I significantly updated documentation and would like to try again to get approval if we can; if you see she was declined a few months ago wanted you all to know I added a lot to the documentation so we can try again. And thank you for all your wonderful help.  thanks)

## 2023-08-26 NOTE — Telephone Encounter (Signed)
Chronic Migraine CPT 64615  Botox J0585 Units:200  G43.711 Chronic Migraine without aura, intractable, with status migrainous  

## 2023-08-27 ENCOUNTER — Ambulatory Visit: Payer: Self-pay | Admitting: Primary Care

## 2023-08-29 NOTE — Telephone Encounter (Signed)
 Faxed signed PA form and notes to [place] [facility] [place] @ [phone].

## 2023-09-02 NOTE — Telephone Encounter (Signed)
 Received fax of approval from [employer] of [place], she will be buy/bill like she was before. Pt is already scheduled for next Botox  in September with Camilo Cella.  Auth#: [id number] (08/29/23-08/28/24)

## 2023-10-15 DIAGNOSIS — F840 Autistic disorder: Secondary | ICD-10-CM | POA: Diagnosis not present

## 2023-11-06 DIAGNOSIS — F840 Autistic disorder: Secondary | ICD-10-CM | POA: Diagnosis not present

## 2023-11-07 ENCOUNTER — Encounter: Payer: Self-pay | Admitting: Neurology

## 2023-11-14 DIAGNOSIS — F840 Autistic disorder: Secondary | ICD-10-CM | POA: Diagnosis not present

## 2023-11-18 ENCOUNTER — Encounter: Payer: Self-pay | Admitting: Psychiatry

## 2023-11-18 ENCOUNTER — Ambulatory Visit (INDEPENDENT_AMBULATORY_CARE_PROVIDER_SITE_OTHER): Admitting: Psychiatry

## 2023-11-18 DIAGNOSIS — F840 Autistic disorder: Secondary | ICD-10-CM | POA: Diagnosis not present

## 2023-11-18 DIAGNOSIS — F411 Generalized anxiety disorder: Secondary | ICD-10-CM | POA: Diagnosis not present

## 2023-11-18 DIAGNOSIS — F410 Panic disorder [episodic paroxysmal anxiety] without agoraphobia: Secondary | ICD-10-CM | POA: Diagnosis not present

## 2023-11-18 DIAGNOSIS — F900 Attention-deficit hyperactivity disorder, predominantly inattentive type: Secondary | ICD-10-CM

## 2023-11-18 MED ORDER — ESCITALOPRAM OXALATE 20 MG PO TABS: 20.0000 mg | ORAL_TABLET | Freq: Every day | ORAL | 1 refills | Status: DC

## 2023-11-18 MED ORDER — GUANFACINE HCL ER 2 MG PO TB24: ORAL_TABLET | ORAL | 1 refills | Status: DC

## 2023-11-18 MED ORDER — QUILLIVANT XR 25 MG/5ML PO SRER: ORAL | 0 refills | Status: DC

## 2023-11-18 NOTE — Progress Notes (Signed)
 [facility] [address], [place]   Follow-up visit  Date of Service: 11/18/2023  CC/Purpose: Routine medication management follow up.    Terri [name] is a 18 y.o. female with a past psychiatric history of ADHD, ASD, anxiety who presents today for a psychiatric follow up appointment. Patient is in the custody of parents.    The patient was last seen on 08/12/23, at which time the following plan was established: Medication management:             - Continue Lexapro  20mg  daily for anxiety             - Intuniv  2mg  nightly for ADHD             - Quillivant  2.35mL daily and 1mL around lunch time for ADHD _______________________________________________________________________________________ Acute events/encounters since last visit: denies    Terri [name] presents alone. She reports that things have been going well for her. She has been taking her medicine as prescribed without any major issues. She feels that her mood and anxiety are stable, as is her focus. She is living in an apartment this semester, which is going well so far. She has no major concerns at this time. No SI/HI/AVH.    Sleep: stable Appetite: Stable Depression: denies Bipolar symptoms:  denies Current suicidal/homicidal ideations:  denied Current auditory/visual hallucinations:  denied   Non-Suicidal Self-Injury: denies Suicide Attempt History: denies  Psychotherapy: Interested in finding a new therapist  Previous psychiatric medication trials:  Strattera     [employer] for school Living Situation: lives with mom, dad. - in an apartment for school now    Allergies  Allergen Reactions   Penicillins Hives   Doxycycline Rash      Labs:  reviewed  Medical diagnoses: Patient Active Problem List   Diagnosis Date Noted   Chronic migraine without aura, with intractable migraine, so stated, with status migrainosus 08/24/2023   Status migrainosus 08/24/2023   Palpitations  07/09/2023   Localized skin mass, lump, or swelling 07/09/2023   Dizziness 07/09/2023   Migraines 08/28/2022   Chronic sinusitis 02/10/2021   Seasonal allergies 11/08/2020   Preventative health care 08/04/2020   Ingrown toenail 06/22/2019   Paronychia of toe 09/23/2018   Menorrhagia with regular cycle 08/13/2018   Autism disorder 05/22/2018   ADHD, 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   There were no vitals taken for this visit.There is no height or weight on file to calculate BMI.  General Appearance: Neat and Well Groomed  Eye Contact:  Fair  Speech:  Clear and Coherent and Normal Rate  Mood:  Euthymic  Affect:  Appropriate and Congruent, happy  Thought Process:  Goal Directed  Orientation:  Full (Time, Place, and Person)  Thought Content:  Logical  Suicidal Thoughts:  No  Homicidal Thoughts:  No  Memory:  Immediate;   Good  Judgement:  Good  Insight:  Good  Psychomotor Activity:  Normal  Concentration:  Concentration: Good  Recall:  Good  Fund of Knowledge:  Good  Language:  Good  Assets:  Communication Skills Desire for Improvement Financial Resources/Insurance Housing Leisure Time Physical Health Resilience Social Support Talents/Skills Transportation Vocational/Educational  Cognition:  WNL      Assessment   Psychiatric Diagnoses:   ICD-10-CM   1. Autism spectrum disorder with accompanying intellectual impairment, requiring support (  level 1)  F84.0     2. Generalized anxiety disorder with panic attacks  F41.1 escitalopram  (LEXAPRO ) 20 MG tablet   F41.0     3. ADHD, predominantly inattentive type  F90.0 guanFACINE  (INTUNIV ) 2 MG TB24 ER tablet    Methylphenidate  HCl ER (QUILLIVANT  XR) 25 MG/5ML SRER      Patient complexity: Moderate   Patient Education and Counseling:  Supportive  therapy provided for identified psychosocial stressors.  Medication education provided and decisions regarding medication regimen discussed with patient/guardian.   On assessment today, Terri [name] has remained stable from a mental health perspective. She has done well on her medicines, and there is no current indication to make changes. No SI/HI/AVH.   Plan  Medication management:  - Continue Lexapro  20mg  daily for anxiety  - Intuniv  2mg  nightly for ADHD  - Quillivant  2.40mL daily and 1mL around lunch time for ADHD  Labs/Studies:  - none today  Additional recommendations:  - Crisis plan reviewed and patient verbally contracts for safety. Go to ED with emergent symptoms or safety concerns and Risks, benefits, side effects of medications, including any / all black box warnings, discussed with patient, who verbalizes their understanding   Follow Up: Return in 3 months - Call in the interim for any side-effects, decompensation, questions, or problems between now and the next visit.   I have spent 25 minutes reviewing the patients chart, meeting with the patient and family, and reviewing medicines and side effects.   Selinda GORMAN Lauth, MD [facility]

## 2023-11-21 DIAGNOSIS — F840 Autistic disorder: Secondary | ICD-10-CM | POA: Diagnosis not present

## 2023-11-28 ENCOUNTER — Ambulatory Visit: Admitting: Adult Health

## 2023-12-03 ENCOUNTER — Ambulatory Visit: Admitting: Adult Health

## 2023-12-04 ENCOUNTER — Ambulatory Visit: Admitting: Adult Health

## 2023-12-05 ENCOUNTER — Ambulatory Visit: Admitting: Neurology

## 2023-12-05 VITALS — BP 112/76 | Ht 60.0 in | Wt 110.0 lb

## 2023-12-05 DIAGNOSIS — G43711 Chronic migraine without aura, intractable, with status migrainosus: Secondary | ICD-10-CM

## 2023-12-05 MED ORDER — ONABOTULINUMTOXINA 100 UNITS IJ SOLR
155.0000 [IU] | Freq: Once | INTRAMUSCULAR | Status: AC
  Administered 2023-12-05: 155 [IU] via INTRAMUSCULAR

## 2023-12-05 NOTE — Progress Notes (Signed)
   BOTOX  PROCEDURE NOTE FOR MIGRAINE HEADACHE  HISTORY: Terri [name] is here for Botox .  Dr. Ines saw in May gave her sample Botox . Her insurance has now approved. Botox  has worked Adult nurse! 5 since last injection, can last for 3 days.  Can last up to 3 days. Also on Ajovy  as well. Takes Ubrelvy  with good benefit.   Description of procedure:  The patient was placed in a LYING position. The standard protocol was used for Botox  as follows, with 5 units of Botox  injected at each site:   -Procerus muscle, midline injection  -Corrugator muscle, bilateral injection  -Frontalis muscle, bilateral injection, with 2 sites each side, medial injection was performed in the upper one third of the frontalis muscle, in the region vertical from the medial inferior edge of the superior orbital rim. The lateral injection was again in the upper one third of the forehead vertically above the lateral limbus of the cornea, 1.5 cm lateral to the medial injection site.  -Temporalis muscle injection, 4 sites, bilaterally. The first injection was 3 cm above the tragus of the ear, second injection site was 1.5 cm to 3 cm up from the first injection site in line with the tragus of the ear. The third injection site was 1.5-3 cm forward between the first 2 injection sites. The fourth injection site was 1.5 cm posterior to the second injection site.  -Occipitalis muscle injection, 3 sites, bilaterally. The first injection was done one half way between the occipital protuberance and the tip of the mastoid process behind the ear. The second injection site was done lateral and superior to the first, 1 fingerbreadth from the first injection. The third injection site was 1 fingerbreadth superiorly and medially from the first injection site.  -Cervical paraspinal muscle injection, 2 sites, bilateral, the first injection site was 1 cm from the midline of the cervical spine, 3 cm inferior to the lower border of the occipital  protuberance. The second injection site was 1.5 cm superiorly and laterally to the first injection site.  -Trapezius muscle injection was performed at 3 sites, bilaterally. The first injection site was in the upper trapezius muscle halfway between the inflection point of the neck, and the acromion. The second injection site was one half way between the acromion and the first injection site. The third injection was done between the first injection site and the inflection point of the neck.   A 200 unit bottle of Botox  was used, 155 units were injected, the rest of the Botox  was wasted. The patient tolerated the procedure well, there were no complications of the above procedure.  Botox  NDC 9976-6078-97 Lot number [id number] Expiration date 01/2026 BB

## 2023-12-05 NOTE — Progress Notes (Signed)
 Botox - 200 units x 1 vial Lot: [id number] Expiration: 11/27 NDC: 9976-6078-97  Bacteriostatic 0.9% Sodium Chloride- 4 mL  Lot: [id number] Expiration: 01/23/2025 NDC: 9590-8033-97  Dx: H56.288 B/B Witnessed by Briant RAMAN, CMA

## 2023-12-17 DIAGNOSIS — F840 Autistic disorder: Secondary | ICD-10-CM | POA: Diagnosis not present

## 2024-01-07 DIAGNOSIS — F840 Autistic disorder: Secondary | ICD-10-CM | POA: Diagnosis not present

## 2024-01-26 ENCOUNTER — Encounter: Payer: Self-pay | Admitting: Adult Health

## 2024-01-27 MED ORDER — UBRELVY 100 MG PO TABS: 100.0000 mg | ORAL_TABLET | ORAL | 5 refills | Status: AC | PRN

## 2024-02-12 NOTE — Telephone Encounter (Signed)
 Completed auth request to [facility] [place] to switch pt to SP, pending.

## 2024-02-19 MED ORDER — ONABOTULINUMTOXINA 200 UNITS IJ SOLR: INTRAMUSCULAR | 3 refills | Status: AC

## 2024-02-19 NOTE — Telephone Encounter (Signed)
 Botox 200 unit Rx sent to [facility] in [place].

## 2024-02-19 NOTE — Telephone Encounter (Signed)
 Auth was approved, please send rx to [facility] in [place].

## 2024-02-19 NOTE — Addendum Note (Signed)
 Addended by: HILLIARD HEATHER CROME on: 02/19/2024 10:37 AM   Modules accepted: Orders

## 2024-02-24 ENCOUNTER — Telehealth: Payer: Self-pay | Admitting: Psychiatry

## 2024-02-24 ENCOUNTER — Other Ambulatory Visit: Payer: Self-pay

## 2024-02-24 DIAGNOSIS — F900 Attention-deficit hyperactivity disorder, predominantly inattentive type: Secondary | ICD-10-CM

## 2024-02-24 MED ORDER — QUILLIVANT XR 25 MG/5ML PO SRER: ORAL | 0 refills | Status: DC

## 2024-02-24 NOTE — Telephone Encounter (Signed)
 Pt Lvm 11/28 @ 12:55p requesting refill of Quillavant to   [id number] - [place], [place] - [address] [facility] [address] [place] [address] Phone: [phone]  Fax: [phone]   Next appt 12/18

## 2024-02-24 NOTE — Telephone Encounter (Signed)
 Pended

## 2024-03-02 ENCOUNTER — Ambulatory Visit: Admitting: Adult Health

## 2024-03-09 ENCOUNTER — Ambulatory Visit: Admitting: Adult Health

## 2024-03-09 VITALS — BP 107/69 | HR 76

## 2024-03-09 DIAGNOSIS — G43711 Chronic migraine without aura, intractable, with status migrainosus: Secondary | ICD-10-CM | POA: Diagnosis not present

## 2024-03-09 MED ORDER — ONABOTULINUMTOXINA 200 UNITS IJ SOLR
155.0000 [IU] | Freq: Once | INTRAMUSCULAR | Status: AC
  Administered 2024-03-09: 13:00:00 155 [IU] via INTRAMUSCULAR

## 2024-03-09 NOTE — Progress Notes (Signed)
 Botox - 200 units x 1 vial Lot: [id number] Expiration: 3/28 NDC: 9976-6078-97   Bacteriostatic 0.9% Sodium Chloride- 4 mL  Lot: [id number] Expiration: 01/23/2025 NDC: 9590-8033-97   Dx: H56.288 SP Witnessed by  Diandra D

## 2024-03-09 NOTE — Progress Notes (Signed)
 Update 03/09/2024 JM: Patient is here for repeat botox  injection, prior injection on 12/05/2023 with Lauraine, NP.  Noted continued benefit with botox  with at least >50% migraine reduction. She also remains on Ajovy  monthly injection. Use of Ubrelvy  as needed with benefit. Tolerated procedure well today, required to lay flat.   Return in 3 months for repeat injection.     Consent Form Botulism Toxin Injection For Chronic Migraine    Reviewed orally with patient, additionally signature is on file:  Botulism toxin has been approved by the Federal drug administration for treatment of chronic migraine. Botulism toxin does not cure chronic migraine and it may not be effective in some patients.  The administration of botulism toxin is accomplished by injecting a small amount of toxin into the muscles of the neck and head. Dosage must be titrated for each individual. Any benefits resulting from botulism toxin tend to wear off after 3 months with a repeat injection required if benefit is to be maintained. Injections are usually done every 3-4 months with maximum effect peak achieved by about 2 or 3 weeks. Botulism toxin is expensive and you should be sure of what costs you will incur resulting from the injection.  The side effects of botulism toxin use for chronic migraine may include:   -Transient, and usually mild, facial weakness with facial injections  -Transient, and usually mild, head or neck weakness with head/neck injections  -Reduction or loss of forehead facial animation due to forehead muscle weakness  -Eyelid drooping  -Dry eye  -Pain at the site of injection or bruising at the site of injection  -Double vision  -Potential unknown long term risks   Contraindications: You should not have Botox  if you are pregnant, nursing, allergic to albumin, have an infection, skin condition, or muscle weakness at the site of the injection, or have myasthenia gravis, Lambert-Eaton syndrome, or  ALS.  It is also possible that as with any injection, there may be an allergic reaction or no effect from the medication. Reduced effectiveness after repeated injections is sometimes seen and rarely infection at the injection site may occur. All care will be taken to prevent these side effects. If therapy is given over a long time, atrophy and wasting in the muscle injected may occur. Occasionally the patient's become refractory to treatment because they develop antibodies to the toxin. In this event, therapy needs to be modified.  I have read the above information and consent to the administration of botulism toxin.    BOTOX  PROCEDURE NOTE FOR MIGRAINE HEADACHE  Contraindications and precautions discussed with patient(above). Aseptic procedure was observed and patient tolerated procedure. Procedure performed by Harlene Bogaert, AGNP-BC.   The condition has existed for more than 6 months, and pt does not have a diagnosis of ALS, Myasthenia Gravis or Lambert-Eaton Syndrome.  Risks and benefits of injections discussed and pt agrees to proceed with the procedure.  Written consent obtained  These injections are medically necessary. Pt  receives good benefits from these injections. These injections do not cause sedations or hallucinations which the oral therapies may cause.   Description of procedure:  The patient was placed in a sitting position. The standard protocol was used for Botox  as follows, with 5 units of Botox  injected at each site:  -Procerus muscle, midline injection  -Corrugator muscle, bilateral injection  -Frontalis muscle, bilateral injection, with 2 sites each side, medial injection was performed in the upper one third of the frontalis muscle, in the region  vertical from the medial inferior edge of the superior orbital rim. The lateral injection was again in the upper one third of the forehead vertically above the lateral limbus of the cornea, 1.5 cm lateral to the medial  injection site.  -Temporalis muscle injection, 4 sites, bilaterally. The first injection was 3 cm above the tragus of the ear, second injection site was 1.5 cm to 3 cm up from the first injection site in line with the tragus of the ear. The third injection site was 1.5-3 cm forward between the first 2 injection sites. The fourth injection site was 1.5 cm posterior to the second injection site. 5th site laterally in the temporalis  muscleat the level of the outer canthus.  -Occipitalis muscle injection, 3 sites, bilaterally. The first injection was done one half way between the occipital protuberance and the tip of the mastoid process behind the ear. The second injection site was done lateral and superior to the first, 1 fingerbreadth from the first injection. The third injection site was 1 fingerbreadth superiorly and medially from the first injection site.  -Cervical paraspinal muscle injection, 2 sites, bilaterally. The first injection site was 1 cm from the midline of the cervical spine, 3 cm inferior to the lower border of the occipital protuberance. The second injection site was 1.5 cm superiorly and laterally to the first injection site.  -Trapezius muscle injection was performed at 3 sites, bilaterally. The first injection site was in the upper trapezius muscle halfway between the inflection point of the neck, and the acromion. The second injection site was one half way between the acromion and the first injection site. The third injection was done between the first injection site and the inflection point of the neck.    A total of 200 units of Botox  was prepared, 155 units of Botox  was injected as documented above, any Botox  not injected was wasted. The patient tolerated the procedure well, there were no complications of the above procedure.   Harlene Bogaert, AGNP-BC  [facility] [address] [place], [place] [address]  Phone [phone] Fax  [phone] Note: This document was prepared with digital dictation and possible smart phrase technology. Any transcriptional errors that result from this process are unintentional.

## 2024-03-12 ENCOUNTER — Ambulatory Visit: Admitting: Psychiatry

## 2024-03-12 ENCOUNTER — Encounter: Payer: Self-pay | Admitting: Psychiatry

## 2024-03-12 DIAGNOSIS — F840 Autistic disorder: Secondary | ICD-10-CM

## 2024-03-12 DIAGNOSIS — F411 Generalized anxiety disorder: Secondary | ICD-10-CM

## 2024-03-12 DIAGNOSIS — F900 Attention-deficit hyperactivity disorder, predominantly inattentive type: Secondary | ICD-10-CM | POA: Diagnosis not present

## 2024-03-12 DIAGNOSIS — F410 Panic disorder [episodic paroxysmal anxiety] without agoraphobia: Secondary | ICD-10-CM

## 2024-03-12 MED ORDER — ESCITALOPRAM OXALATE 20 MG PO TABS: 20.0000 mg | ORAL_TABLET | Freq: Every day | ORAL | 1 refills | Status: AC

## 2024-03-12 MED ORDER — GUANFACINE HCL ER 2 MG PO TB24: ORAL_TABLET | ORAL | 1 refills | Status: AC

## 2024-03-12 NOTE — Progress Notes (Signed)
 [facility] [id number], [place]   Follow-up visit  Date of Service: 03/12/2024  CC/Purpose: Routine medication management follow up.    Terri [name] is a 18 y.o. female with a past psychiatric history of ADHD, ASD, anxiety who presents today for a psychiatric follow up appointment. Patient is in the custody of parents.    The patient was last seen on 11/18/23, at which time the following plan was established: Medication management:             - Continue Lexapro  20mg  daily for anxiety             - Intuniv  2mg  nightly for ADHD             - Quillivant  2.58mL daily and 1mL around lunch time for ADHD _______________________________________________________________________________________ Acute events/encounters since last visit: denies    Terri [name] presents alone. She reports that things have been going pretty well. She has been taking her medicines as prescribed without any major issues. This semester went well at school. She got good grades and feels she did well with transitioning to her apartment living. She feels good about her medicines and has no concerns today. No SI/HI/AVH.    Sleep: stable Appetite: Stable Depression: denies Bipolar symptoms:  denies Current suicidal/homicidal ideations:  denied Current auditory/visual hallucinations:  denied   Non-Suicidal Self-Injury: denies Suicide Attempt History: denies  Psychotherapy: Interested in finding a new therapist  Previous psychiatric medication trials:  Strattera     [employer] for school Living Situation: lives with mom, dad. - in an apartment for school now    Allergies  Allergen Reactions   Penicillins Hives   Doxycycline Rash      Labs:  reviewed  Medical diagnoses: Patient Active Problem List   Diagnosis Date Noted   Chronic migraine without aura, with intractable migraine, so stated, with status migrainosus 08/24/2023   Status migrainosus 08/24/2023   Palpitations  07/09/2023   Localized skin mass, lump, or swelling 07/09/2023   Dizziness 07/09/2023   Migraines 08/28/2022   Chronic sinusitis 02/10/2021   Seasonal allergies 11/08/2020   Preventative health care 08/04/2020   Ingrown toenail 06/22/2019   Paronychia of toe 09/23/2018   Menorrhagia with regular cycle 08/13/2018   Autism disorder 05/22/2018   ADHD, 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   There were no vitals taken for this visit.There is no height or weight on file to calculate BMI.  General Appearance: Neat and Well Groomed  Eye Contact:  Fair  Speech:  Clear and Coherent and Normal Rate  Mood:  Euthymic  Affect:  Appropriate and Congruent, happy  Thought Process:  Goal Directed  Orientation:  Full (Time, Place, and Person)  Thought Content:  Logical  Suicidal Thoughts:  No  Homicidal Thoughts:  No  Memory:  Immediate;   Good  Judgement:  Good  Insight:  Good  Psychomotor Activity:  Normal  Concentration:  Concentration: Good  Recall:  Good  Fund of Knowledge:  Good  Language:  Good  Assets:  Communication Skills Desire for Improvement Financial Resources/Insurance Housing Leisure Time Physical Health Resilience Social Support Talents/Skills Transportation Vocational/Educational  Cognition:  WNL      Assessment   Psychiatric Diagnoses:   ICD-10-CM   1. ADHD, predominantly inattentive type  F90.0 guanFACINE  (INTUNIV ) 2 MG TB24 ER tablet  2. Generalized anxiety disorder with panic attacks  F41.1 escitalopram  (LEXAPRO ) 20 MG tablet   F41.0     3. Autism spectrum disorder with accompanying intellectual impairment, requiring support (level 1)  F84.0       Patient complexity: Moderate   Patient Education and Counseling:  Supportive therapy provided for identified psychosocial stressors.   Medication education provided and decisions regarding medication regimen discussed with patient/guardian.   On assessment today, Terri [name] has remained stable from a mental health perspective. We will not make any changes today. No SI/HI/AVH.   Plan  Medication management:  - Continue Lexapro  20mg  daily for anxiety  - Intuniv  2mg  nightly for ADHD  - Quillivant  2.60mL daily and 1mL around lunch time for ADHD  Labs/Studies:  - none today  Additional recommendations:  - Crisis plan reviewed and patient verbally contracts for safety. Go to ED with emergent symptoms or safety concerns and Risks, benefits, side effects of medications, including any / all black box warnings, discussed with patient, who verbalizes their understanding   Follow Up: Return in 4 months - Call in the interim for any side-effects, decompensation, questions, or problems between now and the next visit.   I have spent 25 minutes reviewing the patients chart, meeting with the patient and family, and reviewing medicines and side effects.   Selinda GORMAN Lauth, MD [facility]

## 2024-03-13 DIAGNOSIS — Q079 Congenital malformation of nervous system, unspecified: Secondary | ICD-10-CM | POA: Diagnosis not present

## 2024-03-13 DIAGNOSIS — H5213 Myopia, bilateral: Secondary | ICD-10-CM | POA: Diagnosis not present

## 2024-03-13 DIAGNOSIS — G43909 Migraine, unspecified, not intractable, without status migrainosus: Secondary | ICD-10-CM | POA: Diagnosis not present

## 2024-03-13 DIAGNOSIS — H47399 Other disorders of optic disc, unspecified eye: Secondary | ICD-10-CM | POA: Diagnosis not present

## 2024-04-02 ENCOUNTER — Telehealth: Payer: Self-pay | Admitting: Psychiatry

## 2024-04-02 NOTE — Telephone Encounter (Signed)
 Pt called at 1:53p requesting refill of Quillavant to  [id number] - [place], [place] - [address] AT [facility] [address] [place] [address] Phone: [phone]  Fax: [phone]    Next appt 4/17

## 2024-04-03 ENCOUNTER — Other Ambulatory Visit: Payer: Self-pay

## 2024-04-03 DIAGNOSIS — F900 Attention-deficit hyperactivity disorder, predominantly inattentive type: Secondary | ICD-10-CM

## 2024-04-03 MED ORDER — QUILLIVANT XR 25 MG/5ML PO SRER: ORAL | 0 refills | Status: AC

## 2024-04-03 NOTE — Telephone Encounter (Signed)
 Pended for Dr. Conny to send

## 2024-06-04 ENCOUNTER — Ambulatory Visit: Admitting: Adult Health

## 2024-06-08 ENCOUNTER — Ambulatory Visit: Admitting: Adult Health

## 2024-07-10 ENCOUNTER — Ambulatory Visit: Admitting: Psychiatry
